# Patient Record
Sex: Female | Born: 1985 | Hispanic: Yes | Marital: Married | Smoking: Never smoker
Health system: Southern US, Community
[De-identification: ages and names within clinical notes are randomized; demographics above are authoritative.]

## PROBLEM LIST (undated history)

## (undated) HISTORY — PX: EYE SURGERY: SHX253

---

## 2009-06-14 ENCOUNTER — Emergency Department: Payer: Self-pay | Admitting: Emergency Medicine

## 2009-07-24 ENCOUNTER — Encounter: Payer: Self-pay | Admitting: Maternal and Fetal Medicine

## 2009-08-07 ENCOUNTER — Encounter: Payer: Self-pay | Admitting: Maternal and Fetal Medicine

## 2009-08-14 ENCOUNTER — Encounter: Payer: Self-pay | Admitting: Maternal & Fetal Medicine

## 2009-08-17 ENCOUNTER — Encounter: Payer: Self-pay | Admitting: Maternal and Fetal Medicine

## 2009-10-16 ENCOUNTER — Encounter: Payer: Self-pay | Admitting: Maternal and Fetal Medicine

## 2009-11-20 ENCOUNTER — Encounter: Payer: Self-pay | Admitting: Obstetrics & Gynecology

## 2009-11-30 ENCOUNTER — Encounter: Payer: Self-pay | Admitting: Maternal & Fetal Medicine

## 2009-12-07 ENCOUNTER — Encounter: Payer: Self-pay | Admitting: Obstetrics and Gynecology

## 2009-12-14 ENCOUNTER — Encounter: Payer: Self-pay | Admitting: Obstetrics & Gynecology

## 2009-12-18 ENCOUNTER — Inpatient Hospital Stay: Payer: Self-pay

## 2013-02-26 LAB — HM PAP SMEAR: HM Pap smear: NEGATIVE

## 2015-11-17 LAB — HM HIV SCREENING LAB: HM HIV Screening: NEGATIVE

## 2018-03-24 ENCOUNTER — Emergency Department
Admission: EM | Admit: 2018-03-24 | Discharge: 2018-03-24 | Disposition: A | Discharge status: Home / Self Care | Payer: Worker's Compensation | Attending: Emergency Medicine | Admitting: Emergency Medicine

## 2018-03-24 ENCOUNTER — Encounter: Payer: Self-pay | Admitting: Emergency Medicine

## 2018-03-24 ENCOUNTER — Emergency Department: Payer: Worker's Compensation

## 2018-03-24 DIAGNOSIS — M25512 Pain in left shoulder: Secondary | ICD-10-CM | POA: Insufficient documentation

## 2018-03-24 DIAGNOSIS — M545 Low back pain, unspecified: Secondary | ICD-10-CM

## 2018-03-24 LAB — POCT PREGNANCY, URINE: PREG TEST UR: NEGATIVE

## 2018-03-24 MED ORDER — KETOROLAC TROMETHAMINE 30 MG/ML IJ SOLN
30.0000 mg | Freq: Once | INTRAMUSCULAR | Status: AC
  Administered 2018-03-24: 30 mg via INTRAMUSCULAR
  Filled 2018-03-24: qty 1

## 2018-03-24 MED ORDER — CYCLOBENZAPRINE HCL 10 MG PO TABS
5.0000 mg | ORAL_TABLET | Freq: Once | ORAL | Status: AC
  Administered 2018-03-24: 5 mg via ORAL
  Filled 2018-03-24: qty 1

## 2018-03-24 MED ORDER — CYCLOBENZAPRINE HCL 5 MG PO TABS: ORAL_TABLET | ORAL | 0 refills | Status: DC

## 2018-03-24 MED ORDER — OXYCODONE-ACETAMINOPHEN 5-325 MG PO TABS
1.0000 | ORAL_TABLET | Freq: Once | ORAL | Status: AC
  Administered 2018-03-24: 1 via ORAL
  Filled 2018-03-24: qty 1

## 2018-03-24 MED ORDER — IBUPROFEN 600 MG PO TABS: 600.0000 mg | ORAL_TABLET | Freq: Four times a day (QID) | ORAL | 0 refills | Status: AC | PRN

## 2018-03-24 MED ORDER — TRAMADOL HCL 50 MG PO TABS: 50.0000 mg | ORAL_TABLET | Freq: Four times a day (QID) | ORAL | 0 refills | Status: AC | PRN

## 2018-03-24 NOTE — ED Triage Notes (Signed)
Using interpreter Darin Engelsbraham [phone], pt states that she was at work sitting at her desk when a fork lift came by and dropped some boxes on her lower back. Pt was sent here by work. Pt unsure of name of company.

## 2018-03-24 NOTE — ED Provider Notes (Signed)
[facility] Emergency Department Provider Note  ____________________________________________  Time seen: Approximately 10:07 AM  I have reviewed the triage vital signs and the nursing notes.   HISTORY  Chief Complaint Back Pain    HPI Sandra Pollard is a 32 y.o. female presents emergency department for evaluation of low back pain and left shoulder pain after injury at work today.  Patient was sitting at her desk when a forklift operator pushed a box into her back.  She is unsure how much the back weighs but states that it takes 2 people to lift the box.  She can walk but with pain.  Pain does not radiate.  She also has pain to her left shoulder that is worse with movement.  No alleviating measures have been attempted.   History reviewed. No pertinent past medical history.  There are no active problems to display for this patient.   History reviewed. No pertinent surgical history.  Prior to Admission medications   Medication Sig Start Date End Date Taking? Authorizing Provider  cyclobenzaprine (FLEXERIL) 5 MG tablet Take 1-2 tablets 3 times daily as needed 03/24/18   Enid Derry, PA-C  ibuprofen (ADVIL,MOTRIN) 600 MG tablet Take 1 tablet (600 mg total) by mouth every 6 (six) hours as needed. 03/24/18   Enid Derry, PA-C  traMADol (ULTRAM) 50 MG tablet Take 1 tablet (50 mg total) by mouth every 6 (six) hours as needed. 03/24/18 03/24/19  Enid Derry, PA-C    Allergies Patient has no known allergies.  No family history on file.  Social History Social History   Tobacco Use  . Smoking status: Never Smoker  . Smokeless tobacco: Never Used  Substance Use Topics  . Alcohol use: Never    Frequency: Never  . Drug use: Never     Review of Systems  Cardiovascular: No chest pain. Respiratory: No cough. No SOB. Gastrointestinal: No abdominal pain.  No nausea, no vomiting.  Musculoskeletal: Positive for back and shoulder pain. Skin:  Negative for rash, abrasions, lacerations, ecchymosis. Neurological: Negative for headaches, numbness or tingling   ____________________________________________   PHYSICAL EXAM:  VITAL SIGNS: ED Triage Vitals  Enc Vitals Group     BP 03/24/18 0921 (!) 148/68     Pulse Rate 03/24/18 0921 85     Resp 03/24/18 0921 18     Temp 03/24/18 0921 98.8 F (37.1 C)     Temp Source 03/24/18 0921 Oral     SpO2 03/24/18 0921 100 %     Weight 03/24/18 0922 115 lb (52.2 kg)     Height --      Head Circumference --      Peak Flow --      Pain Score 03/24/18 0922 7     Pain Loc --      Pain Edu? --      Excl. in GC? --      Constitutional: Alert and oriented. Well appearing and in no acute distress. Eyes: Conjunctivae are normal. PERRL. EOMI. Head: Atraumatic. ENT:      Ears:      Nose: No congestion/rhinnorhea.      Mouth/Throat: Mucous membranes are moist.  Neck: No stridor.  No cervical spine tenderness to palpation. Cardiovascular: Normal rate, regular rhythm.  Good peripheral circulation. Respiratory: Normal respiratory effort without tachypnea or retractions. Lungs CTAB. Good air entry to the bases with no decreased or absent breath sounds. Gastrointestinal: Bowel sounds 4 quadrants. Soft and nontender to palpation. No guarding or  rigidity. No palpable masses. No distention.  Musculoskeletal: Full range of motion to all extremities. No gross deformities appreciated.  Pain with range of motion to shoulder.  Tenderness to palpation over posterior shoulder.  Tenderness to palpation through lumbar spine and lumbar paraspinal muscle.  Strength equal in lower extremity's bilaterally.  Normal but slow gait. Neurologic:  Normal speech and language. No gross focal neurologic deficits are appreciated.  Skin:  Skin is warm, dry and intact. No rash noted.  No ecchymosis. Psychiatric: Mood and affect are normal. Speech and behavior are normal. Patient exhibits appropriate insight and  judgement.   ____________________________________________   LABS (all labs ordered are listed, but only abnormal results are displayed)  Labs Reviewed  POC URINE PREG, ED  POCT PREGNANCY, URINE   ____________________________________________  EKG   ____________________________________________  RADIOLOGY Lexine Baton, personally viewed and evaluated these images (plain radiographs) as part of my medical decision making, as well as reviewing the written report by the radiologist.  Dg Lumbar Spine Complete  Result Date: 03/24/2018 CLINICAL DATA:  Pain after boxes dropped on back region. EXAM: LUMBAR SPINE - COMPLETE 4+ VIEW COMPARISON:  None. FINDINGS: Frontal, lateral, spot lumbosacral lateral, and bilateral oblique views were obtained. There are 5 non-rib-bearing lumbar type vertebral bodies. There is no fracture or spondylolisthesis. Disc spaces appear normal. There is no appreciable facet arthropathy. IMPRESSION: No fracture or spondylolisthesis.  No evident arthropathy. Electronically Signed   By: Bretta Bang III M.D.   On: 03/24/2018 10:56   Dg Shoulder Left  Result Date: 03/24/2018 CLINICAL DATA:  Boxes dropped on back and shoulder EXAM: LEFT SHOULDER - 2+ VIEW COMPARISON:  None. FINDINGS: Oblique, Y scapular, and axillary images were obtained. No fracture or dislocation. Joint spaces appear normal. No erosive change or intra-articular calcification. Visualized left lung clear. IMPRESSION: No fracture or dislocation.  No evident arthropathy. Electronically Signed   By: Bretta Bang III M.D.   On: 03/24/2018 10:55    ____________________________________________    PROCEDURES  Procedure(s) performed:    Procedures    Medications  oxyCODONE-acetaminophen (PERCOCET/ROXICET) 5-325 MG per tablet 1 tablet (1 tablet Oral Given 03/24/18 1019)  ketorolac (TORADOL) 30 MG/ML injection 30 mg (30 mg Intramuscular Given 03/24/18 1128)  cyclobenzaprine (FLEXERIL) tablet  5 mg (5 mg Oral Given 03/24/18 1128)     ____________________________________________   INITIAL IMPRESSION / ASSESSMENT AND PLAN / ED COURSE  Pertinent labs & imaging results that were available during my care of the patient were reviewed by me and considered in my medical decision making (see chart for details).  Review of the [place] CSRS was performed in accordance of the [facility] prior to dispensing any controlled drugs.     Patient presented to the emergency department for evaluation after work injury.  X-rays are negative for acute bony abnormalities.  Pain improved with Percocet, Toradol, Flexeril.  Patient is up walking around.. Patient will be discharged home with prescriptions for tramadol, ibuprofen, Flexeril. Patient is to follow up with primary care as directed. Patient is given ED precautions to return to the ED for any worsening or new symptoms.     ____________________________________________  FINAL CLINICAL IMPRESSION(S) / ED DIAGNOSES  Final diagnoses:  Acute left-sided low back pain without sciatica  Acute pain of left shoulder      NEW MEDICATIONS STARTED DURING THIS VISIT:  ED Discharge Orders         Ordered    traMADol (ULTRAM) 50 MG tablet  Every 6  hours PRN     03/24/18 1232    ibuprofen (ADVIL,MOTRIN) 600 MG tablet  Every 6 hours PRN     03/24/18 1232    cyclobenzaprine (FLEXERIL) 5 MG tablet     03/24/18 1232              This chart was dictated using voice recognition software/Dragon. Despite best efforts to proofread, errors can occur which can change the meaning. Any change was purely unintentional.    Enid DerryWagner, Breasia Karges, PA-C 03/24/18 1552    Jene EveryKinner, Robert, MD 03/26/18 [phone]

## 2018-03-24 NOTE — ED Notes (Signed)
See triage note  Presents with back pain    States someone dropped some boxes on back

## 2019-04-05 ENCOUNTER — Telehealth: Payer: Self-pay | Admitting: Family Medicine

## 2019-04-05 NOTE — Telephone Encounter (Signed)
Patient is stating she is due for removal of Nexplanon and wishes to have another one inserted. Also is complaining of pain and she thinks it is because it is due to expiring device.

## 2019-04-06 NOTE — Telephone Encounter (Signed)
This RN called pt back with [place], [place] interpreter. Call went to voicemail and message left to return call back to [facility] and call back phone # provided.Ronny Bacon, RN

## 2019-04-06 NOTE — Telephone Encounter (Signed)
Pt had called back to [facility] but then lost phone connection, so this RN with Rowland Lathe to interpret called pt back. Pt states that she was calling as she desires to have Nexplanon removal and reinsertion. Pt has Nexplanon that was placed 03/2016 per [facility] records and per pt. Pt reports she is having some pain at the site of her Nexplanon that started 2 or 3 weeks ago. Pt reports that it is warm and painful to touch and there is a "pimple" near the area, but no swelling, redness or drainage at the site. Pt also reports having a headache and menstrual bleeding that comes and goes for the last 2 to 3 weeks as well. Pt then stated that she needed to go and have [place] call her back after 3pm as she is at work now. Counseled pt we would call her back. Pt states understanding. Spoke with Antoine Primas, PA about pt's symptoms and desire for Nexplanon removal and reinsertion. Per Antoine Primas, PA pt should come in for Prob FP visit to be evaluated for symptoms with arm around Nexplanon site before scheduling for Nexplanon removal and reinsertion. Will call pt back after 3pm per pt's request.Jarrett Albor Eleonore Chiquito, RN

## 2019-04-06 NOTE — Telephone Encounter (Signed)
Called pt back after 3:00pm per pt request with Rowland Lathe to interpret. Counseled pt per Antoine Primas, PA recommendation that she should come in for [facility] Prob visit so they can evaluate her Nexplanon site prior to having Nexplanon removal and reinsertion (see previous phone note). Pt states understanding and that she would like to come in for [facility] Prob visit Friday 04/09/2019. FP Prob visit scheduled for 04/09/2019 at 10am. Pt aware of appt date and time and to arrive at 9:45am for check-in. Pt with no other questions or concerns at this time.Ronny Bacon, RN

## 2019-04-09 ENCOUNTER — Ambulatory Visit: Payer: Self-pay | Admitting: Physician Assistant

## 2019-04-09 ENCOUNTER — Encounter: Payer: Self-pay | Admitting: Physician Assistant

## 2019-04-09 ENCOUNTER — Other Ambulatory Visit: Payer: Self-pay

## 2019-04-09 VITALS — BP 100/52 | Ht 64.0 in | Wt 162.0 lb

## 2019-04-09 DIAGNOSIS — Z113 Encounter for screening for infections with a predominantly sexual mode of transmission: Secondary | ICD-10-CM

## 2019-04-09 DIAGNOSIS — Z01419 Encounter for gynecological examination (general) (routine) without abnormal findings: Secondary | ICD-10-CM

## 2019-04-09 DIAGNOSIS — Z3046 Encounter for surveillance of implantable subdermal contraceptive: Secondary | ICD-10-CM

## 2019-04-09 DIAGNOSIS — Z3009 Encounter for other general counseling and advice on contraception: Secondary | ICD-10-CM

## 2019-04-09 LAB — WET PREP FOR TRICH, YEAST, CLUE
Trichomonas Exam: NEGATIVE
Yeast Exam: NEGATIVE

## 2019-04-09 NOTE — Progress Notes (Signed)
Family Planning Visit  Subjective:  Sandra Pollard is a 33 y.o. being seen today  to discuss family planning options.    She is currently using Nexplanon  for pregnancy prevention. Patient reports she does not  want a pregnancy in the next year. Patient  does not have a problem list on file.  Chief Complaint  Patient presents with  . Contraception    issues with Nexplanon    Patient reports that she would like a Nexplanon removal/reinsertion but has had pain at the site of her current device and also reports redness and warmth in the area.  States that she has not had any change to personal or family history since visit in 2017.  Requests STD testing today and is without symptoms.  Patient denies injury to arm in area where Nexplanon is placed.  Denies numbness, tingling, and loss of strength, bruising and swelling.   Does the patient desire a pregnancy in the next year? (OKQ flowsheet)  See flowsheet for other program required questions.   Body mass index is 27.81 kg/m. - Patient is eligible for diabetes screening based on BMI and age >23?  not applicable HA1C ordered? not applicable  Patient reports 1 of partners in last year. Desires STI screening?  Yes  Does the patient have a current or past history of drug use? No   No components found for: HCV]    Health Maintenance Due  Topic Date Due  . TETANUS/TDAP  07/07/2002  . PAP SMEAR-Modifier  02/27/2016  . INFLUENZA VACCINE  01/30/2019    Review of Systems  All other systems reviewed and are negative.   The following portions of the patient's history were reviewed and updated as appropriate: allergies, current medications, past family history, past medical history, past social history, past surgical history and problem list. Problem list updated.  Objective:   Vitals:   04/09/19 1000  BP: (!) 100/52  Weight: 162 lb (73.5 kg)  Height: 5\' 4"  (1.626 m)    Physical Exam Vitals signs reviewed.   Constitutional:      General: She is not in acute distress.    Appearance: Normal appearance.  HENT:     Head: Normocephalic and atraumatic.  Eyes:     Conjunctiva/sclera: Conjunctivae normal.  Neck:     Musculoskeletal: Neck supple.  Pulmonary:     Effort: Pulmonary effort is normal.  Abdominal:     Palpations: Abdomen is soft. There is no mass.     Tenderness: There is no abdominal tenderness. There is no guarding or rebound.  Genitourinary:    General: Normal vulva.     Rectum: Normal.     Comments: External genitalia/pubic area without nits, lice, edema, erythema, lesions and inguinal adenopathy. Vagina with normal mucosa and discharge. Cervix without visible lesions. Uterus firm, mobile, nt, no masses, no CMT, no adnexal tenderness or fullness. Musculoskeletal: Normal range of motion.     Comments: Upper extremities with equal strength, sensation to touch and reflexes bilaterally. Nexplanon implant palpable in L upper arm and no edema, erythema, or warmth on exam today.  Lymphadenopathy:     Cervical: No cervical adenopathy.  Skin:    General: Skin is warm and dry.     Findings: No bruising, erythema, lesion or rash.  Neurological:     Mental Status: She is alert and oriented to person, place, and time.  Psychiatric:        Mood and Affect: Mood normal.  Behavior: Behavior normal.        Thought Content: Thought content normal.        Judgment: Judgment normal.       Assessment and Plan:  Sandra Pollard is a 33 y.o. female presenting to the [facility] Department for a family planning visit  Contraception counseling: Reviewed all forms of birth control options available including abstinence; over the counter/barrier methods; hormonal contraceptive medication including pill, patch, ring, injection,contraceptive implant; hormonal and nonhormonal IUDs; permanent sterilization options including vasectomy and the various tubal sterilization  modalities. Risks and benefits reviewed.  Questions were answered.  Written information was also given to the patient to review.  Patient desires a removal/reinsertion of the Nexplanon. She will follow up in per her appt  for surveillance.  She was told to call with any further questions, or with any concerns about this method of contraception.  Emphasized use of condoms 100% of the time for STI prevention.  1. Encounter for counseling regarding contraception Counseled that we can remove and reinsert a new Nexplanon if that is what patient desires. Reassured that I do not see any sign of infection on exam today.  2. Screening for STD (sexually transmitted disease) Patient without symptoms today. Await test results.  Counseled that RN will call if needs to RTC for any treatment once results are back. - WET PREP FOR [place], YEAST, CLUE - Chlamydia/Gonorrhea [place] [facility] - HIV [place] [facility] - Syphilis Serology, [place] [facility]  3. Encounter for surveillance of implantable subdermal contraceptive Please schedule for removal/reinsertion per patient request.  4. Gynecologic exam normal Pap due and performed today. Counseled that RN will notify her of results and follow up plan once results are back.  - IGP, Aptima HPV     No follow-ups on file.  Future Appointments  Date Time Provider [place]  04/16/2019  2:40 PM [facility]FP PROVIDER [facility] None    Jerene Dilling, PA

## 2019-04-09 NOTE — Progress Notes (Signed)
Pt here as she is having some issues with her Nexplanon feeling like it is hot to touch and having pain at the site, reports site was really red 2 days ago, also had really bad headaches 2 days ago as well. Nexplanon was placed 03/2016. Pt is satisfied with Nexplanon as BCM. Pt desires blood work for HIV and syphillis and also desires STD screening.Ronny Bacon, RN

## 2019-04-09 NOTE — Progress Notes (Signed)
Wet mount reviewed and no treatment needed per standing order. Pt scheduled for Nexplanon removal and reinsertion for 04/16/2019 at 2:40pm per pt request. Pt aware of appt date and time and to arrive early for check-in and appt card given to pt. Pt also counseled that from now until coming in for appt that she should either not have sex or use condoms every time and pt states understanding. Provider orders completed.Ronny Bacon, RN

## 2019-04-15 LAB — IGP, APTIMA HPV
HPV Aptima: NEGATIVE
PAP Smear Comment: 0

## 2019-04-16 ENCOUNTER — Other Ambulatory Visit: Payer: Self-pay

## 2019-04-16 ENCOUNTER — Ambulatory Visit (LOCAL_COMMUNITY_HEALTH_CENTER): Payer: Self-pay | Admitting: Physician Assistant

## 2019-04-16 ENCOUNTER — Encounter: Payer: Self-pay | Admitting: Physician Assistant

## 2019-04-16 VITALS — BP 117/65 | Ht 64.0 in | Wt 164.0 lb

## 2019-04-16 DIAGNOSIS — Z3046 Encounter for surveillance of implantable subdermal contraceptive: Secondary | ICD-10-CM

## 2019-04-16 DIAGNOSIS — Z3009 Encounter for other general counseling and advice on contraception: Secondary | ICD-10-CM

## 2019-04-16 DIAGNOSIS — Z30017 Encounter for initial prescription of implantable subdermal contraceptive: Secondary | ICD-10-CM

## 2019-04-16 MED ORDER — ETONOGESTREL 68 MG ~~LOC~~ IMPL
68.0000 mg | DRUG_IMPLANT | Freq: Once | SUBCUTANEOUS | Status: AC
  Administered 2019-04-17: 68 mg via SUBCUTANEOUS

## 2019-04-16 NOTE — Progress Notes (Signed)
Pt here today for Nexplanon removal and reinsertion.Ronny Bacon, RN

## 2019-04-17 NOTE — Progress Notes (Signed)
Family Planning Visit- Repeat Yearly Visit  Subjective:  Sandra Pollard is a 33 y.o. being seen today for a Nexplanon removal/reinsertion.    She is currently using Nexplanon for pregnancy prevention. Patient reports she does not  want a pregnancy in the next year. Patient  does not have a problem list on file.  Chief Complaint  Patient presents with  . Contraception    Nexplanon removal and reinsertion    Patient reports that she has had no trouble with the Nexplanon and would like to continue using this method.  Thinks that the last time she had a PE was in 2017 when she got her current Nexplanon and is not sure whether she got her pap then or before that.    Patient denies that she has had any change to her personal or family history since her last visit in 2017.   Does the patient desire a pregnancy in the next year? (OKQ flowsheet)  See flowsheet for other program required questions.   Body mass index is 28.15 kg/m. - Patient is eligible for diabetes screening based on BMI and age >40?  not applicable JW1X ordered? not applicable  Patient reports 1 of partners in last year. Desires STI screening?  No - .  Does the patient have a current or past history of drug use? No   No components found for: HCV]   Health Maintenance Due  Topic Date Due  . TETANUS/TDAP  07/07/2002  . INFLUENZA VACCINE  01/30/2019    Review of Systems  All other systems reviewed and are negative.   The following portions of the patient's history were reviewed and updated as appropriate: allergies, current medications, past family history, past medical history, past social history, past surgical history and problem list. Problem list updated.  Objective:   Vitals:   04/16/19 1456  BP: 117/65  Weight: 164 lb (74.4 kg)  Height: 5\' 4"  (1.626 m)    Physical Exam Vitals signs reviewed.  Constitutional:      General: She is not in acute distress.    Appearance: Normal appearance.   HENT:     Head: Normocephalic and atraumatic.  Eyes:     Conjunctiva/sclera: Conjunctivae normal.  Pulmonary:     Effort: Pulmonary effort is normal.  Musculoskeletal:     Comments: Left upper arm with Nexplanon easily palpable.  Skin:    General: Skin is warm and dry.     Findings: No bruising, erythema, lesion or rash.  Neurological:     Mental Status: She is alert and oriented to person, place, and time.  Psychiatric:        Mood and Affect: Mood normal.        Behavior: Behavior normal.        Thought Content: Thought content normal.        Judgment: Judgment normal.       Assessment and Plan:  Sandra Pollard is a 33 y.o. female presenting to the [facility] Department for a Nexplanon removal/reinsertion.  Contraception counseling: Reviewed all forms of birth control options available including abstinence; over the counter/barrier methods; hormonal contraceptive medication including pill, patch, ring, injection,contraceptive implant; hormonal and nonhormonal IUDs; permanent sterilization options including vasectomy and the various tubal sterilization modalities. Risks and benefits reviewed.  Questions were answered.  Written information was also given to the patient to review.  Patient desires Nexplanon removal/reinsertion, this was prescribed for patient. She will follow up in  3 months for PE  and prn for surveillance.  She was told to call with any further questions, or with any concerns about this method of contraception.  Emphasized use of condoms 100% of the time for STI prevention.  1. Encounter for counseling regarding contraception Counseled patient that PE is due now and pap is also likely due either now or soon. Rec condoms with all sex for 10 days after reinsertion. Call with questions or concerns.  2. Nexplanon removal/reinsertion Nexplanon Removal and Insertion  Patient identified, informed consent performed, consent signed.   Patient does  understand that irregular bleeding is a very common side effect of this medication. She was advised to have backup contraception for one week after replacement of the implant. Patient deemed to meet WHO criteria for being reasonably certain she is not pregnant.  Appropriate time out taken. Nexplanon site identified. Area prepped in usual sterile fashon. 2 ml of 1% lidocaine with epinephrine was used to anesthetize the area at the distal end of the implant. A small stab incision was made right beside the implant on the distal portion. The Nexplanon rod was grasped manually and removed without difficulty. There was minimal blood loss. There were no complications.   Confirmed correct location of insertion site. The insertion site was identified 8-10 cm (3-4 inches) from the medial epicondyle of the humerus and 3-5 cm (1.25-2 inches) posterior to (below) the sulcus (groove) between the biceps and triceps muscles of the patient's Left arm. New Nexplanon removed from packaging, Device confirmed in needle, then inserted full length of needle and withdrawn per handbook instructions. Nexplanon was able to palpated in the patient's Left arm; patient palpated the insert herself.  There was minimal blood loss. Patient insertion site covered with guaze and a pressure bandage to reduce any bruising. The patient tolerated the procedure well and was given post procedure instructions.   Nexplanon:   Counseled patient to take OTC analgesic starting as soon as lidocaine starts to wear off and take regularly for at least 48 hr to decrease discomfort.  Specifically to take with food or milk to decrease stomach upset and for IB 600 mg (3 tablets) every 6 hrs; IB 800 mg (4 tablets) every 8 hrs; or Aleve 2 tablets every 12 hrs.  - etonogestrel (NEXPLANON) implant 68 mg     No follow-ups on file.  No future appointments.  Matt Holmes, PA

## 2019-06-18 ENCOUNTER — Encounter: Payer: Self-pay | Admitting: Physician Assistant

## 2019-06-18 NOTE — Progress Notes (Signed)
Patient with pap done 04/09/2019.  Pap negative and HPV negative.  Repeat cotest recommended 03/2024, (5 years).

## 2019-06-20 NOTE — Progress Notes (Signed)
Pap card mailed. Next pap due in 5 years per [place] PA. Aileen Fass, RN

## 2021-05-27 ENCOUNTER — Emergency Department: Payer: Self-pay

## 2021-05-27 ENCOUNTER — Emergency Department
Admission: EM | Admit: 2021-05-27 | Discharge: 2021-05-27 | Disposition: A | Discharge status: Home / Self Care | Payer: Self-pay | Attending: Emergency Medicine | Admitting: Emergency Medicine

## 2021-05-27 ENCOUNTER — Other Ambulatory Visit: Payer: Self-pay

## 2021-05-27 ENCOUNTER — Encounter: Payer: Self-pay | Admitting: Emergency Medicine

## 2021-05-27 DIAGNOSIS — N63 Unspecified lump in unspecified breast: Secondary | ICD-10-CM | POA: Insufficient documentation

## 2021-05-27 DIAGNOSIS — N6310 Unspecified lump in the right breast, unspecified quadrant: Secondary | ICD-10-CM

## 2021-05-27 DIAGNOSIS — N6001 Solitary cyst of right breast: Secondary | ICD-10-CM | POA: Insufficient documentation

## 2021-05-27 DIAGNOSIS — N644 Mastodynia: Secondary | ICD-10-CM

## 2021-05-27 LAB — CBC
HCT: 39.5 % (ref 36.0–46.0)
Hemoglobin: 13 g/dL (ref 12.0–15.0)
MCH: 30.5 pg (ref 26.0–34.0)
MCHC: 32.9 g/dL (ref 30.0–36.0)
MCV: 92.7 fL (ref 80.0–100.0)
Platelets: 318 10*3/uL (ref 150–400)
RBC: 4.26 MIL/uL (ref 3.87–5.11)
RDW: 12.2 % (ref 11.5–15.5)
WBC: 8.8 10*3/uL (ref 4.0–10.5)
nRBC: 0 % (ref 0.0–0.2)

## 2021-05-27 LAB — COMPREHENSIVE METABOLIC PANEL
ALT: 23 U/L (ref 0–44)
AST: 21 U/L (ref 15–41)
Albumin: 4 g/dL (ref 3.5–5.0)
Alkaline Phosphatase: 76 U/L (ref 38–126)
Anion gap: 6 (ref 5–15)
BUN: 10 mg/dL (ref 6–20)
CO2: 28 mmol/L (ref 22–32)
Calcium: 8.9 mg/dL (ref 8.9–10.3)
Chloride: 103 mmol/L (ref 98–111)
Creatinine, Ser: 0.63 mg/dL (ref 0.44–1.00)
GFR, Estimated: >60 mL/min (ref >60)
Glucose, Bld: 114 mg/dL — ABNORMAL HIGH (ref 70–99)
Potassium: 3.6 mmol/L (ref 3.5–5.1)
Sodium: 137 mmol/L (ref 135–145)
Total Bilirubin: 0.6 mg/dL (ref 0.3–1.2)
Total Protein: 7.8 g/dL (ref 6.5–8.1)

## 2021-05-27 LAB — POC URINE PREG, ED: Preg Test, Ur: NEGATIVE

## 2021-05-27 LAB — TROPONIN I (HIGH SENSITIVITY): Troponin I (High Sensitivity): <2 ng/L (ref <18)

## 2021-05-27 MED ORDER — IBUPROFEN 400 MG PO TABS
400.0000 mg | ORAL_TABLET | Freq: Once | ORAL | Status: AC
  Administered 2021-05-27: 15:00:00 400 mg via ORAL
  Filled 2021-05-27: qty 1

## 2021-05-27 MED ORDER — ACETAMINOPHEN 500 MG PO TABS
1000.0000 mg | ORAL_TABLET | Freq: Once | ORAL | Status: AC
  Administered 2021-05-27: 15:00:00 1000 mg via ORAL
  Filled 2021-05-27: qty 2

## 2021-05-27 NOTE — ED Notes (Signed)
In room for breast exam with MD

## 2021-05-27 NOTE — ED Provider Notes (Signed)
Emergency Medicine Provider Triage Evaluation Note  Sandra Pollard , a 35 y.o. female  was evaluated in triage.  Pt complains of midsternal chest pain that radiates down her right arm and began Friday prior to arrival.  Patient states that it is constant but has progressively gotten worse since onset.  Patient states worsening of this pain with any movement at the right shoulder or with taking a deep breath.  Review of Systems  Positive: Pleuritic chest pain, right arm pain Negative: Shortness of breath, nausea/vomiting/diarrhea, syncope  Physical Exam  BP 129/90 (BP Location: Right Arm)   Pulse 83   Temp 98.1 F (36.7 C) (Oral)   Resp 20   Ht 5\' 4"  (1.626 m)   Wt 74.4 kg   SpO2 99%   BMI 28.15 kg/m  Gen:   Awake, no distress   Resp:  Normal effort  MSK:   Moves extremities without difficulty  Other:  Right upper chest wall tenderness to palpation  Medical Decision Making  Medically screening exam initiated at 10:42 AM.  Appropriate orders placed.  Sandra Pollard was informed that the remainder of the evaluation will be completed by another provider, this initial triage assessment does not replace that evaluation, and the importance of remaining in the ED until their evaluation is complete.     Sandra Carry, MD 05/27/21 1043

## 2021-05-27 NOTE — Discharge Instructions (Addendum)
Your [place] today showed: 1. Septated cyst in the right breast at 3 o'clock, 6 cm from the nipple, 1.7 cm in greatest dimension, with adjacent hyperechoic tissue suggesting inflammation. Suspect that this is an inflammatory cyst. Abscess/infection felt less likely.   RECOMMENDATION: 1. Follow-up, outpatient diagnostic mammography and possible repeat ultrasound for further assessment.

## 2021-05-27 NOTE — ED Triage Notes (Signed)
Pt via POV from home c/o mid-sternal CP and radiates down her R arm that started Friday that has progressively gotten worse. Denies SOB. Denies NVD. Pt states she feels hot. Pt states when she moves it hurts more. Pt is A&Ox4 and NAD.  Pt needs a spanish interpreter.

## 2021-05-27 NOTE — ED Provider Notes (Signed)
[facility] Emergency Department Provider Note  ____________________________________________   Event Date/Time   First MD Initiated Contact with Patient 05/27/21 1506     (approximate)  I have reviewed the triage vital signs and the nursing notes.   HISTORY  Chief Complaint Chest Pain   HPI Sandra Pollard is a 35 y.o. female without significant past medical history who presents for assessment of 3 to 4 days of some pain on the inside of the right breast approximately the 2 o'clock position just off the areola as well some soreness in the right axilla.  Patient denies any bleeding or drainage from the nipple, injuries, cough, fevers, shortness of breath, headache, earache, sore throat, back pain rash or extremity pain.  Denies any vomiting or diarrhea.  She has a Nexplanon in her upper extremity.  No other acute concerns at this time.  She has not taken any analgesia today.         History reviewed. No pertinent past medical history.  There are no problems to display for this patient.   History reviewed. No pertinent surgical history.  Prior to Admission medications   Medication Sig Start Date End Date Taking? Authorizing Provider  cyclobenzaprine (FLEXERIL) 5 MG tablet Take 1-2 tablets 3 times daily as needed Patient not taking: Reported on 04/09/2019 03/24/18   Enid Derry, PA-C  etonogestrel (NEXPLANON) 68 MG IMPL implant 1 each by Subdermal route once. 04/01/16   Larene Pickett, FNP  ibuprofen (ADVIL,MOTRIN) 600 MG tablet Take 1 tablet (600 mg total) by mouth every 6 (six) hours as needed. Patient not taking: Reported on 04/16/2019 03/24/18   Enid Derry, PA-C    Allergies Patient has no known allergies.  History reviewed. No pertinent family history.  Social History Social History   Tobacco Use   Smoking status: Never   Smokeless tobacco: Never  Substance Use Topics   Alcohol use: Never   Drug use: Never    Review of  Systems  Review of Systems  Constitutional:  Negative for chills and fever.  HENT:  Negative for sore throat.   Eyes:  Negative for pain.  Respiratory:  Negative for cough and stridor.   Cardiovascular:  Positive for chest pain.  Gastrointestinal:  Negative for vomiting.  Genitourinary:  Negative for dysuria.  Musculoskeletal:  Negative for myalgias.  Skin:  Negative for rash.  Neurological:  Negative for seizures, loss of consciousness and headaches.  Psychiatric/Behavioral:  Negative for suicidal ideas.   All other systems reviewed and are negative.    ____________________________________________   PHYSICAL EXAM:  VITAL SIGNS: ED Triage Vitals  Enc Vitals Group     BP 05/27/21 1036 129/90     Pulse Rate 05/27/21 1036 83     Resp 05/27/21 1036 20     Temp 05/27/21 1036 98.1 F (36.7 C)     Temp Source 05/27/21 1036 Oral     SpO2 05/27/21 1036 99 %     Weight 05/27/21 1037 164 lb (74.4 kg)     Height 05/27/21 1037 5\' 4"  (1.626 m)     Head Circumference --      Peak Flow --      Pain Score 05/27/21 1037 8     Pain Loc --      Pain Edu? --      Excl. in GC? --    Vitals:   05/27/21 1036 05/27/21 1503  BP: 129/90 120/86  Pulse: 83 76  Resp: 20 18  Temp: 98.1 F (36.7 C)   SpO2: 99% 100%   Physical Exam Vitals and nursing note reviewed.  Constitutional:      General: She is not in acute distress.    Appearance: She is well-developed.  HENT:     Head: Normocephalic and atraumatic.     Right Ear: External ear normal.     Left Ear: External ear normal.     Nose: Nose normal.  Eyes:     Conjunctiva/sclera: Conjunctivae normal.  Cardiovascular:     Rate and Rhythm: Normal rate and regular rhythm.     Heart sounds: No murmur heard. Pulmonary:     Effort: Pulmonary effort is normal. No respiratory distress.     Breath sounds: Normal breath sounds.  Abdominal:     Palpations: Abdomen is soft.     Tenderness: There is no abdominal tenderness.   Musculoskeletal:        General: No swelling.     Cervical back: Neck supple.  Skin:    General: Skin is warm and dry.     Capillary Refill: Capillary refill takes less than 2 seconds.  Neurological:     Mental Status: She is alert and oriented to person, place, and time.  Psychiatric:        Mood and Affect: Mood normal.    On heparin exam of the patient's breast there is a palpable mass under the skin around the 2 o'clock position that is tender.  No overlying skin changes.  Areola is unremarkable.  There is also some mild right axillary lymphadenopathy.  There is no induration or other changes in left breast is unremarkable. ____________________________________________   LABS (all labs ordered are listed, but only abnormal results are displayed)  Labs Reviewed  COMPREHENSIVE METABOLIC PANEL - Abnormal; Notable for the following components:      Result Value   Glucose, Bld 114 (*)    All other components within normal limits  CBC  POC URINE PREG, ED  TROPONIN I (HIGH SENSITIVITY)   ____________________________________________  EKG  ECG remarkable for sinus rhythm with a ventricular rate of 78, normal axis, unremarkable intervals without evidence of acute ischemia or significant arrhythmia. ____________________________________________  RADIOLOGY  ED MD interpretation: Chest x-ray shows no effusion, edema, pneumothorax or focal consolidation or other acute process.  Ultrasound of the breast shows a septated cyst of the right breast at the 3 o'clock position with some hypoechoic tissue concerning for inflammation although felt less likely to be an abscess at this time.  Official radiology report(s): DG Chest 2 View  Result Date: 05/27/2021 CLINICAL DATA:  Chest pain EXAM: CHEST - 2 VIEW COMPARISON:  None. FINDINGS: Heart size and mediastinal contours are within normal limits. No suspicious pulmonary opacities identified. No pleural effusion or pneumothorax visualized. No  acute osseous abnormality appreciated. IMPRESSION: No acute intrathoracic process identified. Electronically Signed   By: Jannifer Hick M.D.   On: 05/27/2021 11:31   [place] CHEST SOFT TISSUE  Result Date: 05/27/2021 CLINICAL DATA:  Right breast pain. EXAM: ULTRASOUND OF THE RIGHT BREAST COMPARISON:  None. FINDINGS: Targeted ultrasound is performed, showing a cystic mass at 3 o'clock, 6 cm the nipple, measuring 1.7 x 1.0 x 1.5 cm. There are thin internal septations and a lobulated, circumscribed margin. Surrounding increased echogenicity is noted suggesting inflammation. IMPRESSION: 1. Septated cyst in the right breast at 3 o'clock, 6 cm from the nipple, 1.7 cm in greatest dimension, with adjacent hyperechoic tissue suggesting inflammation. Suspect that this is  an inflammatory cyst. Abscess/infection felt less likely. RECOMMENDATION: 1. Follow-up, outpatient diagnostic mammography and possible repeat ultrasound for further assessment. I have discussed the findings and recommendations with the patient. If applicable, a reminder letter will be sent to the patient regarding the next appointment. BI-RADS CATEGORY  3: Probably benign. Electronically Signed   By: Amie Portland M.D.   On: 05/27/2021 17:23    ____________________________________________   PROCEDURES  Procedure(s) performed (including Critical Care):  Procedures   ____________________________________________   INITIAL IMPRESSION / ASSESSMENT AND PLAN / ED COURSE      Patient presents with above-stated history exam for assessment of some pain on the right breast as described above.  On arrival she is afebrile and hemodynamically stable.  On assessment she does have an area of tenderness unable to palpate a mass in the right breast.  There is also some right axillar lymphadenopathy.  Patient did have some work-up initiated in triage.  However on my exam primary differential is related to possible cyst versus malignancy versus abscess  versus blocked duct.  There is no overlying skin changes fever or leukocytosis on CBC to suggest sepsis or systemic bacteremia.  Chest x-ray shows no effusion, edema, pneumothorax or focal consolidation or other acute process.  ECG remarkable for sinus rhythm with a ventricular rate of 78, normal axis, unremarkable intervals without evidence of acute ischemia or significant arrhythmia.  Nonelevated troponin not suggestive of ACS.  Pregnancy test is negative.  CMP shows no significant electrolyte metabolic derangements.  Ultrasound obtained.  Ultrasound shows appears to be inflammatory cyst with findings left likely representing an abscess per radiology.  Discussed this at length with the patient and has been using interpreter and strong condition to her follow-up in surgery clinic as patient will likely require further imaging with a mammogram and repeat ultrasound and possibly sampling with needle aspiration.  She may take Tylenol ibuprofen as needed for discomfort.  Discharged stable condition.  Strict return precautions advised and discussed.     ____________________________________________   FINAL CLINICAL IMPRESSION(S) / ED DIAGNOSES  Final diagnoses:  Breast pain, right  Mass of right breast, unspecified quadrant  Cyst of right breast    Medications  acetaminophen (TYLENOL) tablet 1,000 mg (1,000 mg Oral Given 05/27/21 1527)  ibuprofen (ADVIL) tablet 400 mg (400 mg Oral Given 05/27/21 1527)     ED Discharge Orders     None        Note:  This document was prepared using Dragon voice recognition software and may include unintentional dictation errors.    Gilles Chiquito, MD 05/27/21 [id number]

## 2021-05-31 ENCOUNTER — Ambulatory Visit (INDEPENDENT_AMBULATORY_CARE_PROVIDER_SITE_OTHER): Payer: Self-pay | Admitting: Surgery

## 2021-05-31 ENCOUNTER — Other Ambulatory Visit: Payer: Self-pay

## 2021-05-31 ENCOUNTER — Encounter: Payer: Self-pay | Admitting: Surgery

## 2021-05-31 VITALS — BP 136/84 | HR 72 | Temp 99.0°F | Ht 64.0 in | Wt 179.4 lb

## 2021-05-31 DIAGNOSIS — N6009 Solitary cyst of unspecified breast: Secondary | ICD-10-CM | POA: Insufficient documentation

## 2021-05-31 DIAGNOSIS — N6001 Solitary cyst of right breast: Secondary | ICD-10-CM

## 2021-05-31 DIAGNOSIS — N6312 Unspecified lump in the right breast, upper inner quadrant: Secondary | ICD-10-CM | POA: Insufficient documentation

## 2021-05-31 NOTE — Patient Instructions (Signed)
la llamaremos y programaremos una mamografa y Neomia Dear ecografa de sus senos.  le pediremos que haga un seguimiento aqu despus de que obtengamos todos los Laura.  haremos que alguien del programa [facility] se comunique con usted para Barista su mamografa

## 2021-05-31 NOTE — Progress Notes (Signed)
Patient ID: Sandra Pollard, female   DOB: 06/07/1986, 35 y.o.   MRN: [id number]  Chief Complaint: Tender mass right medial breast  History of Present Illness Sandra Pollard is a 35 y.o. female with acute onset of a tender mass in the patient's right medial breast.  Presented to the ED where an ultrasound was obtained showing a complex cyst, partially thickened cystic wall possibly inflammatory in nature.  No aspiration or formal treatment applied.  She apparently has reasonable pain control with ibuprofen and Tylenol.  Ultrasound was obtained from the ED, and a formal breast ultrasound/mammography was requested.  Patient is extremely worried about cancer.  She has had no fevers or chills.  No overlying skin changes or nipple discharge.  No recent breast trauma.  She is had no prior breast biopsies.  Her current birth control is Nexplanon implant she has no family history of breast cancer.  She began menstruating at the age of 3.  She is gravida 3 para 3.  Her first pregnancy was it was at the age of 32.  She breast-fed 3 children.  Past Medical History No past medical history on file.    No past surgical history on file.  No Known Allergies  Current Outpatient Medications  Medication Sig Dispense Refill   cyclobenzaprine (FLEXERIL) 5 MG tablet Take 1-2 tablets 3 times daily as needed (Patient not taking: Reported on 04/09/2019) 20 tablet 0   etonogestrel (NEXPLANON) 68 MG IMPL implant 1 each by Subdermal route once.     ibuprofen (ADVIL,MOTRIN) 600 MG tablet Take 1 tablet (600 mg total) by mouth every 6 (six) hours as needed. (Patient not taking: Reported on 04/16/2019) 30 tablet 0   No current facility-administered medications for this visit.    Family History No family history on file.    Social History Social History   Tobacco Use   Smoking status: Never   Smokeless tobacco: Never  Substance Use Topics   Alcohol use: Never   Drug use: Never        Review  of Systems  Constitutional:  Positive for chills, fever and malaise/fatigue.  HENT: Negative.    Eyes: Negative.   Respiratory: Negative.    Cardiovascular: Negative.   Gastrointestinal:  Positive for heartburn.  Genitourinary: Negative.   Skin: Negative.   Neurological:  Positive for headaches.  Psychiatric/Behavioral: Negative.       Physical Exam There were no vitals taken for this visit.   CONSTITUTIONAL: Well developed, and nourished, appropriately responsive and aware without distress.   EYES: Sclera non-icteric.   EARS, NOSE, MOUTH AND THROAT: Mask worn.    Hearing is intact to voice.  NECK: Trachea is midline, and there is no jugular venous distension.  LYMPH NODES:  Lymph nodes in the neck are not enlarged. RESPIRATORY:  Lungs are clear, and breath sounds are equal bilaterally. Normal respiratory effort without pathologic use of accessory muscles. CARDIOVASCULAR: Heart is regular in rate and rhythm. GI: The abdomen is  soft, nontender, and nondistended. There were no palpable masses. I did not appreciate hepatosplenomegaly. There were normal bowel sounds. GU: Caryl Lyn present as chaperone along with the interpreter.  There is a well-defined medial right breast mass consistent with the cyst in question.  It is mobile from the chest wall, and does not appear to involve the overlying skin.  There is no erythema, no evidence of nipple discharge.  Otherwise there are no dominant masses nor nodularity nor skin changes in either breast.  MUSCULOSKELETAL:  Symmetrical muscle tone appreciated in all four extremities.    SKIN: Skin turgor is normal. No pathologic skin lesions appreciated.  NEUROLOGIC:  Motor and sensation appear grossly normal.  Cranial nerves are grossly without defect. PSYCH:  Alert and oriented to person, place and time. Affect is appropriate for situation.  Data Reviewed I have personally reviewed what is currently available of the patient's imaging, recent labs  and medical records.   Labs:  CBC Latest Ref Rng & Units 05/27/2021  WBC 4.0 - 10.5 K/uL 8.8  Hemoglobin 12.0 - 15.0 g/dL 54.5  Hematocrit 62.5 - 46.0 % 39.5  Platelets 150 - 400 K/uL 318   CMP Latest Ref Rng & Units 05/27/2021  Glucose 70 - 99 mg/dL 638(L)  BUN 6 - 20 mg/dL 10  Creatinine 3.73 - 4.28 mg/dL 7.68  Sodium 115 - 726 mmol/L 137  Potassium 3.5 - 5.1 mmol/L 3.6  Chloride 98 - 111 mmol/L 103  CO2 22 - 32 mmol/L 28  Calcium 8.9 - 10.3 mg/dL 8.9  Total Protein 6.5 - 8.1 g/dL 7.8  Total Bilirubin 0.3 - 1.2 mg/dL 0.6  Alkaline Phos 38 - 126 U/L 76  AST 15 - 41 U/L 21  ALT 0 - 44 U/L 23      Imaging: Radiology review:  CLINICAL DATA:  Right breast pain.   EXAM: ULTRASOUND OF THE RIGHT BREAST   COMPARISON:  None.   FINDINGS: Targeted ultrasound is performed, showing a cystic mass at 3 o'clock, 6 cm the nipple, measuring 1.7 x 1.0 x 1.5 cm. There are thin internal septations and a lobulated, circumscribed margin. Surrounding increased echogenicity is noted suggesting inflammation.   IMPRESSION: 1. Septated cyst in the right breast at 3 o'clock, 6 cm from the nipple, 1.7 cm in greatest dimension, with adjacent hyperechoic tissue suggesting inflammation. Suspect that this is an inflammatory cyst. Abscess/infection felt less likely.   RECOMMENDATION: 1. Follow-up, outpatient diagnostic mammography and possible repeat ultrasound for further assessment.   I have discussed the findings and recommendations with the patient. If applicable, a reminder letter will be sent to the patient regarding the next appointment.   BI-RADS CATEGORY  3: Probably benign.     Electronically Signed   By: Amie Portland M.D.   On: 05/27/2021 17:23  Within last 24 hrs: No results found.  Assessment    Right breast mass, tender symptomatic, prior ultrasound showing complex cystic lesion. There are no problems to display for this patient.   Plan    Advised we proceed with  traditional breast imaging, and consideration of ultrasound-guided aspiration/biopsy.   Expected that pain will diminish with a cyst aspiration, I gave the degree of reassurance considering that breast cancer typically does not present with a painful cyst, however we will reserve final judgment until complete work-up is done.  We will have her follow-up after the imaging or as ordered.  Advise she continue to utilize Tylenol for pain. Face-to-face time spent with the patient and accompanying care providers(if present) was 30 minutes, with more than 50% of the time spent counseling, educating, and coordinating care of the patient.    These notes generated with voice recognition software. I apologize for typographical errors.  Campbell Lerner M.D., FACS 05/31/2021, 11:32 AM

## 2021-06-19 ENCOUNTER — Ambulatory Visit: Payer: Self-pay | Admitting: Surgery

## 2021-06-20 ENCOUNTER — Ambulatory Visit
Admission: RE | Admit: 2021-06-20 | Discharge: 2021-06-20 | Disposition: A | Discharge status: Home / Self Care | Payer: Self-pay | Source: Ambulatory Visit | Attending: Oncology | Admitting: Oncology

## 2021-06-20 ENCOUNTER — Encounter: Payer: Self-pay | Admitting: *Deleted

## 2021-06-20 ENCOUNTER — Other Ambulatory Visit: Payer: Self-pay

## 2021-06-20 ENCOUNTER — Ambulatory Visit: Payer: Self-pay | Attending: Oncology | Admitting: *Deleted

## 2021-06-20 VITALS — BP 124/72 | HR 73 | Temp 99.1°F | Ht 65.0 in | Wt 179.5 lb

## 2021-06-20 DIAGNOSIS — N6311 Unspecified lump in the right breast, upper outer quadrant: Secondary | ICD-10-CM | POA: Insufficient documentation

## 2021-06-20 NOTE — Progress Notes (Signed)
Subjective:     Patient ID: Sandra Pollard, female   DOB: Apr 03, 1986, 35 y.o.   MRN: [id number]  HPI  [facility] Medical History Record - 06/20/21 8315       Breast History   Screening cycle New    CBE Date 05/31/21    Provider (CBE) Dr. Claudine Mouton    Initial Mammogram 06/20/21    Last Mammogram Never    Recent Breast Symptoms Lump;Pain      Breast Cancer History   Breast Cancer History No personal or family history      Previous History of Breast Problems   Breast Surgery or Biopsy None    Breast Implants N/A    BSE Done Monthly      Gynecological/Obstetrical History   LMP 06/18/21    Is there any chance that the client could be pregnant?  No    Age at menarche 41    Age at menopause NA    Date of last PAP  --   2021   Provider (PAP) health department    Age at first live birth 86    Breast fed children Yes (type length in comments)   1 year   DES Exposure Unkown    Cervical, Uterine or Ovarian cancer No    Family history of Cervial, Uterine or Ovarian cancer No    Hysterectomy No    Cervix removed No    Ovaries removed No    Laser/Cryosurgery No    Current method of birth control Other (see comments)   Nexplanon   Current method of Estrogen/Hormone replacement None    Smoking history None               Review of Systems     Objective:   Physical Exam Chest:  Breasts:    Right: No swelling, bleeding, inverted nipple, mass, nipple discharge, skin change or tenderness.     Left: No swelling, bleeding, inverted nipple, mass, nipple discharge, skin change or tenderness.    Lymphadenopathy:     Upper Body:     Right upper body: No supraclavicular or axillary adenopathy.     Left upper body: No supraclavicular or axillary adenopathy.      Assessment:     35 year old Hispanic female referred to [facility] by [facility] for financial assistance for further evaluation of a right breast mass.  Sandra Pollard, the interpreter present during the interview and  exam.  Patient states she went to the ER in November for right breast pain.  An ultrasound was completed showing a septated cyst in the right breast.  Ultrasound was a birads 3 recommending a diagnostic mammogram.  Patient was then seen by Dr. Claudine Mouton.  Patient has no family history of breast or ovarian cancer.  On clinical breast exam today there is no dominant mass, skin changes, nipple discharge or lymphadenopathy.  I can palpate an approximate 1-2 cm thickening at 1-2:00 right breast.  The patient states this is the area of pain.  She also states that the mass has gotten smaller.  Taught self breast awareness.  Patient has been screened for eligibility.  She does not have any insurance, Medicare or Medicaid.  She also meets financial eligibility.   Risk Assessment     Risk Scores       06/20/2021   Last edited by: Jim Like, RN   5-year risk: 0.2 %   Lifetime risk: 5.9 %  Plan:     Will get bilateral diagnostic mammogram and ultrasound.  Patient has a scheduled follow up with Dr. Claudine Mouton on 07/05/21 per patient.  Will follow up per [facility] protocol.

## 2021-06-26 ENCOUNTER — Ambulatory Visit: Payer: Self-pay | Admitting: Surgery

## 2021-07-05 ENCOUNTER — Other Ambulatory Visit: Payer: Self-pay

## 2021-07-05 ENCOUNTER — Encounter: Payer: Self-pay | Admitting: Surgery

## 2021-07-05 ENCOUNTER — Ambulatory Visit (INDEPENDENT_AMBULATORY_CARE_PROVIDER_SITE_OTHER): Payer: Self-pay | Admitting: Surgery

## 2021-07-05 DIAGNOSIS — N6009 Solitary cyst of unspecified breast: Secondary | ICD-10-CM

## 2021-07-05 DIAGNOSIS — N6001 Solitary cyst of right breast: Secondary | ICD-10-CM

## 2021-07-05 NOTE — Patient Instructions (Addendum)
Please call our office if you have any questions or concerns. Be sure to have the Breast Ultrasound done prior to your follow up appointment.   Autoexamen de [employer] Breast Self-Awareness El autoexamen de mamas es para Solicitor la apariencia y la sensibilidad de las [place]. Es importante autoexaminarse las [place]. Permite detectar un problema en las mamas a tiempo mientras todava es pequeo y puede tratarse. Todas las mujeres deben autoexaminarse las [place], incluso aquellas que se sometieron a implantes mamarios. Informe al mdico si advierte un cambio en las mamas. Lo que necesita: Un espejo. Una habitacin bien iluminada. Cmo realizar el autoexamen de mamas El autoexamen de [place] es una forma de aprender qu es normal para sus mamas y revisar si hay cambios. Para hacer un autoexamen de las mamas: Busque cambios  Qutese toda la ropa por encima de la cintura. Prese frente a un espejo en una habitacin con buena iluminacin. Apoye las [employer] caderas. Empuje hacia abajo con las manos. Mrese las [employer] y los pezones en el espejo para ver si una mama o un pezn se ve diferente del otro. Durante el examen, intente determinar si: La forma de una mama es diferente. El tamao de una mama es diferente. Hay arrugas, depresiones y protuberancias en Deborha Payment y no en la otra. Observe cada mama para buscar cambios en la piel, por ejemplo: Enrojecimiento. Zonas escamosas. Observe si hay cambios en los pezones, por ejemplo: Lquido alrededor [employer]. Sangrado. Hoyuelos. Enrojecimiento. Un cambio en el lugar de los pezones. [place] si hay cambios  Acustese en el piso boca arriba. Plpese cada mama. Para hacerlo, siga estos pasos: Elija una mama para palpar. Coloque el brazo ms cercano a esa mama por encima de la cabeza. Use el otro brazo para palpar la zona del pezn de la mama. Plpese la zona con las yemas de los tres dedos del medio y haga crculos con los dedos. Con el primer  crculo, presione suavemente. Con el segundo, ms fuerte. Con el tercero, an ms fuerte. Siga haciendo crculos con los dedos con las diferentes presiones a medida que desciende por la mama. Detngase cuando sienta las costillas. Desplace los dedos un poco hacia el centro del cuerpo. Empiece a hacer crculos con los dedos nuevamente y esta vez haga movimientos ascendentes hasta llegar a la clavcula. Siga haciendo crculos [place] y [place] llegar a la axila. Recuerde hacerlos con las tres presiones. Plpese la otra mama de la misma forma. Sintese o prese en la ducha o la baera. Con agua jabonosa en la piel, plpese cada mama del mismo modo que lo hizo en el paso 2 mientras estaba acostada en el piso. Anote sus hallazgos Anotar lo que encuentra puede ayudarla a recordar qu contarle al mdico. [place] los siguientes datos: Qu es normal para cada mama. Cualquier cambio que encuentre en cada mama, por ejemplo: La clase de cambios que encuentra. Si tiene dolor. Si hay bultos, su tamao y [place]. Cundo tuvo su ltimo perodo menstrual. Consejos generales [employer]. Si est amamantando, el mejor momento para el examen de las mamas es despus de darle de Psychologist, clinical al beb o despus de usar un sacaleches. Si tiene perodos menstruales, el mejor momento para Media planner es de 5 a 7 das despus de la finalizado el perodo menstrual. Con el [place], se sentir cmoda con el autoexamen y Games developer a saber si hay cambios en sus mamas. Comunquese con un mdico si: Observa un  cambio en la forma o el tamao de las mamas o los pezones. Observa un cambio en la piel de las mamas o los pezones, como la piel enrojecida o escamosa. Tiene secrecin de lquido proveniente de los pezones que no es normal. [place] un ndulo o una zona engrosada que no tena antes. Tiene dolor en las [place]. Tiene alguna inquietud [employer] de la [place]. Resumen El autoexamen de  mamas incluye buscar cambios en las [place], y tambin palpar para Engineer, manufacturing cualquier cambio en las [place]. El autoexamen de mamas debe hacerse frente a un espejo en una habitacin bien iluminada. Debe revisarse las [employer]. Si tiene perodos menstruales, el mejor momento para hacerlo es de 5 a 7 das despus de la finalizado el perodo menstrual. Informe al mdico si ve cambios en las mamas, como cambios en el tamao, cambios en la piel, dolor o sensibilidad, o un lquido inusual que sale de los pezones. Esta informacin no tiene Theme park manager el consejo del mdico. Asegrese de hacerle al mdico cualquier pregunta que tenga. Document Revised: 03/17/2018 Document Reviewed: 03/17/2018 Elsevier Patient Education  2022 [employer].

## 2021-07-05 NOTE — Progress Notes (Signed)
Patient ID: Sandra Pollard, female   DOB: [birth year], 36 y.o.   MRN: [id number]  Chief Complaint: Tender mass right medial breast  History of Present Illness Our Sandra Pollard is present, she reports the area of tenderness is spontaneously resolved, confirming that she took no other medications or antibiotics.  She is much relieved knowing that the imaging is consistent with a benign process.  She is anticipating repeat ultrasound in 3 months.  Previously:Sandra Pollard is a 36 y.o. female with acute onset of a tender mass in the patient's right medial breast.  Presented to the ED where an ultrasound was obtained showing a complex cyst, partially thickened cystic wall possibly inflammatory in nature.  No aspiration or formal treatment applied.  She apparently has reasonable pain control with ibuprofen and Tylenol.  Ultrasound was obtained from the ED, and a formal breast ultrasound/mammography was requested.  Patient is extremely worried about cancer.  She has had no fevers or chills.  No overlying skin changes or nipple discharge.  No recent breast trauma.  She is had no prior breast biopsies.  Her current birth control is Nexplanon implant she has no family history of breast cancer.  She began menstruating at the age of 74.  She is gravida 3 para 3.  Her first pregnancy was it was at the age of 46.  She breast-fed 3 children.  Past Medical History History reviewed. No pertinent past medical history.    Past Surgical History:  Procedure Laterality Date   EYE SURGERY      No Known Allergies  Current Outpatient Medications  Medication Sig Dispense Refill   acetaminophen (TYLENOL) 500 MG tablet Take 500 mg by mouth every 6 (six) hours as needed.     etonogestrel (NEXPLANON) 68 MG IMPL implant 1 each by Subdermal route once.     ibuprofen (ADVIL,MOTRIN) 600 MG tablet Take 1 tablet (600 mg total) by mouth every 6 (six) hours as needed. 30 tablet 0   Propylene Glycol  0.6 % SOLN INSTILL 1 DROP 3-5 TIMES A DAY IN EACH EYE AS NEEDED FOR DRYNESS     No current facility-administered medications for this visit.    Family History History reviewed. No pertinent family history.    Social History Social History   Tobacco Use   Smoking status: Never   Smokeless tobacco: Never  Substance Use Topics   Alcohol use: Never   Drug use: Never        Review of Systems  Constitutional:  Positive for chills, fever and malaise/fatigue.  HENT: Negative.    Eyes: Negative.   Respiratory: Negative.    Cardiovascular: Negative.   Gastrointestinal:  Positive for heartburn.  Genitourinary: Negative.   Skin: Negative.   Neurological:  Positive for headaches.  Psychiatric/Behavioral: Negative.       Physical Exam There were no vitals taken for this visit.   CONSTITUTIONAL: Well developed, and nourished, appropriately responsive and aware without distress.   EYES: Sclera non-icteric.   EARS, NOSE, MOUTH AND THROAT: Mask worn.    Hearing is intact to voice.  NECK: Trachea is midline, and there is no jugular venous distension.  LYMPH NODES:  Lymph nodes in the neck are not enlarged. RESPIRATORY:  Lungs are clear, and breath sounds are equal bilaterally. Normal respiratory effort without pathologic use of accessory muscles. CARDIOVASCULAR: Heart is regular in rate and rhythm. GI: The abdomen is  soft, nontender, and nondistended. There were no palpable masses. I did not appreciate hepatosplenomegaly.  There were normal bowel sounds. GU: Sandra Pollard is present as chaperone along with the Pollard.  The previously well-defined medial right breast mass consistent with the cyst, has markedly diminished to vagueness.  This area remains mobile from the chest wall, and does not appear to involve the overlying skin.  There is no erythema, no evidence of nipple discharge.  Otherwise there are no dominant masses nor nodularity nor skin changes in either breast. MUSCULOSKELETAL:   Symmetrical muscle tone appreciated in all four extremities.    SKIN: Skin turgor is normal. No pathologic skin lesions appreciated.  NEUROLOGIC:  Motor and sensation appear grossly normal.  Cranial nerves are grossly without defect. PSYCH:  Alert and oriented to person, place and time. Affect is appropriate for situation.  Data Reviewed I have personally reviewed what is currently available of the patient's imaging, recent labs and medical records.   Labs:  CBC Latest Ref Rng & Units 05/27/2021  WBC 4.0 - 10.5 K/uL 8.8  Hemoglobin 12.0 - 15.0 g/dL 13.0  Hematocrit 36.0 - 46.0 % 39.5  Platelets 150 - 400 K/uL 318   CMP Latest Ref Rng & Units 05/27/2021  Glucose 70 - 99 mg/dL 114(H)  BUN 6 - 20 mg/dL 10  Creatinine 0.44 - 1.00 mg/dL 0.63  Sodium 135 - 145 mmol/L 137  Potassium 3.5 - 5.1 mmol/L 3.6  Chloride 98 - 111 mmol/L 103  CO2 22 - 32 mmol/L 28  Calcium 8.9 - 10.3 mg/dL 8.9  Total Protein 6.5 - 8.1 g/dL 7.8  Total Bilirubin 0.3 - 1.2 mg/dL 0.6  Alkaline Phos 38 - 126 U/L 76  AST 15 - 41 U/L 21  ALT 0 - 44 U/L 23      Imaging: Radiology review:  CLINICAL DATA:  36 year old female presenting for follow-up evaluation. Patient was seen in the emergency department on November 27th for pain in the lump in the right breast. On ultrasound at that time there was a mass at 3 o'clock felt to represent an inflamed cyst, less likely an abscess. The patient was placed on antibiotics. Patient reports significant improvement in symptoms in the right breast with no residual pain. She reports the mass is gotten significantly smaller.   EXAM: DIGITAL DIAGNOSTIC BILATERAL MAMMOGRAM WITH TOMOSYNTHESIS AND CAD; ULTRASOUND RIGHT BREAST LIMITED   TECHNIQUE: Bilateral digital diagnostic mammography and breast tomosynthesis was performed. The images were evaluated with computer-aided detection.; Targeted ultrasound examination of the right breast was performed   COMPARISON:  Previous  exam(s).   ACR Breast Density Category d: The breast tissue is extremely dense, which lowers the sensitivity of mammography.   FINDINGS: Mammogram:   Right breast: A skin BB marks the palpable site of concern in the upper inner right breast. A spot tangential view of this area was performed in addition to standard views. No definite abnormality is identified on the spot imaging. Additional spot compression tomosynthesis view was performed for a possible mass in the lower inner right breast. Possible cluster of small masses persists in the slightly inner inferior aspect of the breast measuring approximately 1 cm.   Left breast: No suspicious mass, distortion, or microcalcifications are identified to suggest presence of malignancy.   On physical exam of the medial right breast I feel a subtle area of thickening at the site where patient reports there was previously a mass. There is no erythema or skin changes.   Ultrasound:   Targeted ultrasound performed throughout the medial aspect of the right breast demonstrating multiple  scattered anechoic cysts and clusters of cysts. No suspicious solid mass is identified. At 3 o'clock 6 cm from the nipple there is an area of echogenic tissue with a small amount of interspersed fluid, no significant collection or cystic mass persists.   IMPRESSION: 1. At the palpable site of concern in the medial right breast there is a persistent subtle area of thickening which on ultrasound demonstrates an area of echogenic tissue with a small amount of interspersed fluid, likely residual infection/inflammation. No suspicious solid mass or drainable collection.   2.  No mammographic evidence of malignancy in the left breast.   RECOMMENDATION: Recommend patient complete course of antibiotics and return in 3 months for follow-up right breast ultrasound to ensure complete resolution of the presumed infection/inflammation in the medial right breast.  Patient was counseled to contact our office sooner should her symptoms worsen.   I have discussed the findings and recommendations with the patient. If applicable, a reminder letter will be sent to the patient regarding the next appointment.   BI-RADS CATEGORY  3: Probably benign.     Electronically Signed   By: Audie Pinto M.D.   On: 06/20/2021 11:15  Previously: CLINICAL DATA:  Right breast pain.   EXAM: ULTRASOUND OF THE RIGHT BREAST   COMPARISON:  None.   FINDINGS: Targeted ultrasound is performed, showing a cystic mass at 3 o'clock, 6 cm the nipple, measuring 1.7 x 1.0 x 1.5 cm. There are thin internal septations and a lobulated, circumscribed margin. Surrounding increased echogenicity is noted suggesting inflammation.   IMPRESSION: 1. Septated cyst in the right breast at 3 o'clock, 6 cm from the nipple, 1.7 cm in greatest dimension, with adjacent hyperechoic tissue suggesting inflammation. Suspect that this is an inflammatory cyst. Abscess/infection felt less likely.   RECOMMENDATION: 1. Follow-up, outpatient diagnostic mammography and possible repeat ultrasound for further assessment.   I have discussed the findings and recommendations with the patient. If applicable, a reminder letter will be sent to the patient regarding the next appointment.   BI-RADS CATEGORY  3: Probably benign.     Electronically Signed   By: Lajean Manes M.D.   On: 05/27/2021 17:23  Within last 24 hrs: No results found.  Assessment    Right breast mass, no longer tender, ultrasound confirming likely benign complex cystic lesion. Patient Active Problem List   Diagnosis Date Noted   Mass of upper inner quadrant of right breast 05/31/2021   Complex cyst of breast 05/31/2021     Plan    At the present time there is nothing to warrant aspiration to improve her symptoms as they improved on their own.  I believe her anxiety is markedly diminished.  We will obtain follow-up  ultrasound in 3 months and repeat clinical exam after this. We will have her follow-up after the imaging or as ordered.  Advise she continue to utilize Tylenol for pain, if needed. Face-to-face time spent with the patient and accompanying care providers(if present) was 15 minutes, with more than 50% of the time spent counseling, educating, and coordinating care of the patient.    These notes generated with voice recognition software. I apologize for typographical errors.  Ronny Bacon M.D., FACS 07/05/2021, 9:35 AM

## 2021-07-06 ENCOUNTER — Encounter: Payer: Self-pay | Admitting: *Deleted

## 2021-07-06 ENCOUNTER — Other Ambulatory Visit: Payer: Self-pay | Admitting: *Deleted

## 2021-07-06 DIAGNOSIS — N6310 Unspecified lump in the right breast, unspecified quadrant: Secondary | ICD-10-CM

## 2021-07-06 NOTE — Progress Notes (Signed)
Letter mailed to inform patient of her next ultrasound of the right breast on 09/24/21 @ 1:20.  She is also scheduled to follow up with Dr. Toula Moos on 09/27/21.  If patient needs any further assistance with [facility], I am transitioning care to Fonnie Mu, RN with [facility].  Will cc Dr. Claudine Mouton and Wynona Canes this note.

## 2021-09-24 ENCOUNTER — Ambulatory Visit
Admission: RE | Admit: 2021-09-24 | Discharge: 2021-09-24 | Disposition: A | Discharge status: Home / Self Care | Payer: Self-pay | Source: Ambulatory Visit | Attending: Obstetrics and Gynecology | Admitting: Obstetrics and Gynecology

## 2021-09-24 ENCOUNTER — Other Ambulatory Visit: Payer: Self-pay

## 2021-09-24 DIAGNOSIS — N6310 Unspecified lump in the right breast, unspecified quadrant: Secondary | ICD-10-CM | POA: Insufficient documentation

## 2021-09-27 ENCOUNTER — Ambulatory Visit: Payer: Self-pay | Admitting: Surgery

## 2023-10-23 ENCOUNTER — Ambulatory Visit: Payer: Self-pay | Admitting: Family Medicine

## 2023-10-23 ENCOUNTER — Encounter: Payer: Self-pay | Admitting: Family Medicine

## 2023-10-23 ENCOUNTER — Ambulatory Visit (LOCAL_COMMUNITY_HEALTH_CENTER): Payer: Self-pay | Admitting: Family Medicine

## 2023-10-23 VITALS — BP 118/72 | HR 72 | Ht 64.0 in | Wt 181.4 lb

## 2023-10-23 DIAGNOSIS — Z3046 Encounter for surveillance of implantable subdermal contraceptive: Secondary | ICD-10-CM

## 2023-10-23 DIAGNOSIS — Z30017 Encounter for initial prescription of implantable subdermal contraceptive: Secondary | ICD-10-CM

## 2023-10-23 DIAGNOSIS — Z3202 Encounter for pregnancy test, result negative: Secondary | ICD-10-CM

## 2023-10-23 DIAGNOSIS — Z309 Encounter for contraceptive management, unspecified: Secondary | ICD-10-CM

## 2023-10-23 DIAGNOSIS — Z131 Encounter for screening for diabetes mellitus: Secondary | ICD-10-CM

## 2023-10-23 DIAGNOSIS — N898 Other specified noninflammatory disorders of vagina: Secondary | ICD-10-CM | POA: Insufficient documentation

## 2023-10-23 DIAGNOSIS — Z124 Encounter for screening for malignant neoplasm of cervix: Secondary | ICD-10-CM

## 2023-10-23 LAB — PREGNANCY, URINE: Preg Test, Ur: NEGATIVE

## 2023-10-23 LAB — WET PREP FOR TRICH, YEAST, CLUE
Trichomonas Exam: NEGATIVE
Yeast Exam: NEGATIVE

## 2023-10-23 MED ORDER — ETONOGESTREL 68 MG ~~LOC~~ IMPL
68.0000 mg | DRUG_IMPLANT | Freq: Once | SUBCUTANEOUS | Status: AC
  Administered 2023-10-23: 68 mg via SUBCUTANEOUS

## 2023-10-23 NOTE — Progress Notes (Signed)
 See separate encounter for MD note and associated orders.   Tempie Fee, MD 10/23/23  5:23 PM

## 2023-10-23 NOTE — Progress Notes (Signed)
 [employer] [facility] DEPARTMENT [facility] [address], [address] [place] [address] Main phone: [phone]  Family Planning Visit - Initial Visit  Subjective:  Sandra Pollard is a 38 y.o.  G3P3   being seen today for an initial annual visit and to discuss reproductive life planning.  The patient is currently using hormonal implant for pregnancy prevention. Patient does not want a pregnancy in the next year.   Patient reports they are looking for a method with the following characteristics:  High efficacy at preventing pregnancy Long term method  Patient has the following medical conditions: Patient Active Problem List   Diagnosis Date Noted   Vaginal discharge 10/23/2023   Mass of upper inner quadrant of right breast 05/31/2021   Complex cyst of breast 05/31/2021   No chief complaint on file.  HPI Patient reports she would like an annual physical, PAP smear, nexplanon  removal and replacement. She is amenable to diabetes screening.   This current Nexplanon  was placed 04/16/2019.  Patient denies problems or concerned regarding her current Nexplanon .    Review of Systems  Constitutional:  Negative for fever, malaise/fatigue and weight loss.  Respiratory:  Negative for shortness of breath.   Cardiovascular:  Negative for chest pain and palpitations.   Diabetes screening This patient is 38 y.o. with a BMI of There is no height or weight on file to calculate BMI..  Is patient eligible for diabetes screening (age >35 and BMI >25)?  yes  Was Hgb A1c ordered? yes  STI screening Patient reports 1 of partners in last year.  Does this patient desire STI screening?  No - declines  Hepatitis C screening Has patient been screened once for HCV in the past?  No  No results found for: "HCVAB"  Does the patient meet criteria for HCV testing? No   Hepatitis B screening Does the patient meet criteria for HBV testing? No  Cervical Cancer  Screening  Result Date Procedure Results Follow-ups  04/09/2019 IGP, Aptima HPV DIAGNOSIS:: Comment Specimen adequacy:: Comment Clinician Provided ICD10: Comment Performed by:: Comment PAP Smear Comment: . Note:: Comment Test Methodology: Comment HPV Aptima: Negative   02/26/2013 HM PAP SMEAR HM Pap smear: Negative     Health Maintenance Due  Topic Date Due   COVID-19 Vaccine (1) Never done   Hepatitis C Screening  Never done   DTaP/Tdap/Td (1 - Tdap) Never done   The following portions of the patient's history were reviewed and updated as appropriate: allergies, current medications, past family history, past medical history, past social history, past surgical history and problem list. Problem list updated.  See flowsheet for other program required questions.  Objective:  There were no vitals filed for this visit.  Physical Exam Vitals and nursing note reviewed. Exam conducted with a chaperone present Susanna Epley, CNA).  Constitutional:      General: She is not in acute distress.    Appearance: Normal appearance. She is normal weight. She is not toxic-appearing.  HENT:     Head: Normocephalic and atraumatic.     Mouth/Throat:     Mouth: Mucous membranes are moist.     Pharynx: Oropharynx is clear. No oropharyngeal exudate or posterior oropharyngeal erythema.  Eyes:     General: No scleral icterus.       Right eye: No discharge.        Left eye: No discharge.     Conjunctiva/sclera: Conjunctivae normal.  Cardiovascular:     Rate and Rhythm:  Normal rate and regular rhythm.     Heart sounds: Normal heart sounds. No murmur heard.    No gallop.  Pulmonary:     Effort: Pulmonary effort is normal.  Abdominal:     General: Abdomen is flat. Bowel sounds are normal. There is no distension.     Palpations: Abdomen is soft. There is no mass.     Tenderness: There is no abdominal tenderness. There is no guarding or rebound.  Genitourinary:    General: Normal vulva.     Exam  position: Lithotomy position.     Pubic Area: No rash or pubic lice.      Tanner stage (genital): 5.     Labia:        Right: No rash or lesion.        Left: No rash or lesion.      Vagina: Normal. No vaginal discharge, erythema, bleeding or lesions.     Cervix: Cervical bleeding (menstrual blood within vault) present. No cervical motion tenderness, discharge, friability, lesion or erythema.     Uterus: Normal.      Adnexa: Right adnexa normal and left adnexa normal.     Comments: pH not collected due to no vaginal complaints Musculoskeletal:        General: Normal range of motion.     Cervical back: Neck supple. No rigidity or tenderness.  Lymphadenopathy:     Head:     Right side of head: No preauricular or posterior auricular adenopathy.     Left side of head: No preauricular or posterior auricular adenopathy.     Cervical: No cervical adenopathy.     Right cervical: No superficial or posterior cervical adenopathy.    Left cervical: No superficial or posterior cervical adenopathy.     Upper Body:     Right upper body: No supraclavicular adenopathy.     Left upper body: No supraclavicular adenopathy.  Skin:    General: Skin is warm and dry.     Capillary Refill: Capillary refill takes less than 2 seconds.     Findings: No rash.  Neurological:     General: No focal deficit present.     Mental Status: She is alert and oriented to person, place, and time.  Psychiatric:        Mood and Affect: Mood normal.        Behavior: Behavior normal.    Procedure:   Nexplanon  Removal and Insertion  Patient identified, informed consent performed, consent signed.   Patient does understand that irregular bleeding is a very common side effect of this medication. She was advised to have backup contraception for one week after replacement of the implant. Patient deemed to meet WHO criteria for being reasonably certain she is not pregnant.  Appropriate time out taken. Nexplanon  site identified.  Area prepped in usual sterile fashon. 3 ml of 1% lidocaine with epinephrine was used to anesthetize the area at the distal end of the implant. A small stab incision was made right beside the implant on the distal portion. The Nexplanon  rod was grasped using hemostats and removed without difficulty. There was minimal blood loss. There were no complications.   Confirmed correct location of insertion site. The insertion site was identified 8-10 cm (3-4 inches) from the medial epicondyle of the humerus and 3-5 cm (1.25-2 inches) posterior to (below) the sulcus (groove) between the biceps and triceps muscles of the patient's left arm. New Nexplanon  removed from packaging, Device confirmed in needle, then  inserted full length of needle and withdrawn per handbook instructions. Nexplanon  was able to palpated in the patient's left arm; patient palpated the insert herself.  There was minimal blood loss. Patient insertion site covered with guaze and a pressure bandage to reduce any bruising. The patient tolerated the procedure well and was given post procedure instructions.     Assessment and Plan:  Sandra Pollard is a 38 y.o. female presenting to the [facility] for an initial annual wellness/contraceptive visit  Contraception counseling: Reviewed options based on patient desire and reproductive life plan. Patient is interested in Hormonal Implant. This was provided to the patient today.   Risks, benefits, and typical effectiveness rates were reviewed.  Questions were answered.  Written information was also given to the patient to review.    The patient will follow up in  1 years for surveillance.  The patient was told to call with any further questions, or with any concerns about this method of contraception.  Emphasized use of condoms 100% of the time for STI prevention.  Educated on ECP and assessed for need of ECP. Patient did not meet criteria.  Nexplanon   removal  Nexplanon  insertion -     Pregnancy, urine -     Etonogestrel   Vaginal discharge -     WET PREP FOR TRICH, YEAST, CLUE  Cervical cancer screening -     IGP, Aptima HPV  Screening for diabetes mellitus (DM) -     Hgb A1c w/o eAG   No follow-ups on file.  No future appointments.  Jack Marts, MD

## 2023-10-23 NOTE — Patient Instructions (Addendum)
 I translated the following text using Google translate.  Please excuse any errors.  He traducido el siguiente texto con el traductor de Microbiologist. Disculpe cualquier error.   PAP Smear Today we performed a PAP smear to screen for cervical cancer. The results should be back in 1-2 weeks.  Once we have the results we can determine when your next screening should be.   Prueba de Papanicolaou Hoy le realizamos una prueba de Papanicolaou para detectar cncer de cuello uterino. Los resultados deberan estar disponibles en 1 o 2 semanas. Una vez que tengamos los [place], podremos determinar cundo debe realizarse su prxima prueba de deteccin.  Nexplanon  removal  Today we removed your Nexplanon  implant.   After care: Women may have discomfort and some bruising following the removal of Nexplanon . Wear your pressure bandage for a full 24 hours, then an adhesive bandage for 3-5 days. Allow the wound closure strips to fall off naturally with routine showering - do not remove forcefully.   Reasons to seek care: Fever and chills Swelling and redness of the Nexplanon  site Abnormal or foul drainage from the Nexplanon  removal site Extraccin de Nexplanon  [employer] extrajimos el implante de Nexplanon .  Cuidados posteriores:  Las mujeres pueden sentir molestias y algunos moretones despus de la extraccin de Nexplanon .  Use el vendaje compresivo durante 24 horas completas y luego un vendaje Rushie Courier durante 3 a 5 das.  Deje que las tiras de cierre de la herida se desprendan naturalmente con las duchas habituales; no las retire con Pensions consultant.  Razones para buscar atencin mdica:  Fiebre y escalofros  Hinchazn y enrojecimiento de la zona de Nexplanon   Supuracin anormal o ftida de la zona de extraccin de Nexplanon 

## 2023-10-23 NOTE — Progress Notes (Signed)
 Pt is here for Nexplanon  removal and re-insertion. Nexplanon  removed successfully and new Nexplanon  inserted today by Tempie Fee, MD. Patient tolerated well to reinsertion at the Lt arm with no complication. Opportunity given to Pt to ask questions for any clarifications. Condoms declined. Austine Lefort, RN.

## 2023-10-24 LAB — HGB A1C W/O EAG: Hgb A1c MFr Bld: 5.4 % (ref 4.8–5.6)

## 2023-10-31 LAB — IGP, APTIMA HPV
HPV Aptima: NEGATIVE
PAP Smear Comment: 0

## 2023-11-11 ENCOUNTER — Ambulatory Visit: Payer: Self-pay

## 2023-12-11 IMAGING — MG DIGITAL DIAGNOSTIC BILAT W/ TOMO W/ CAD
8 of 14 series · 8 of 40 positions shown · non-contrast
Comparison: Previous exam(s).

CLINICAL DATA: 35-year-old female presenting for follow-up
evaluation. Patient was seen in the emergency department on [REDACTED] for pain in the lump in the right breast. On ultrasound at that
time there was a mass at 3 o'clock felt to represent an inflamed
cyst, less likely an abscess. The patient was placed on antibiotics.
Patient reports significant improvement in symptoms in the right
breast with no residual pain. She reports the mass is gotten
significantly smaller.

EXAM:
DIGITAL DIAGNOSTIC BILATERAL MAMMOGRAM WITH TOMOSYNTHESIS AND CAD;
ULTRASOUND RIGHT BREAST LIMITED
TECHNIQUE: Bilateral digital diagnostic mammography and breast tomosynthesis
was performed. The images were evaluated with computer-aided
detection.; Targeted ultrasound examination of the right breast was
performed

[R ML synth-2D]
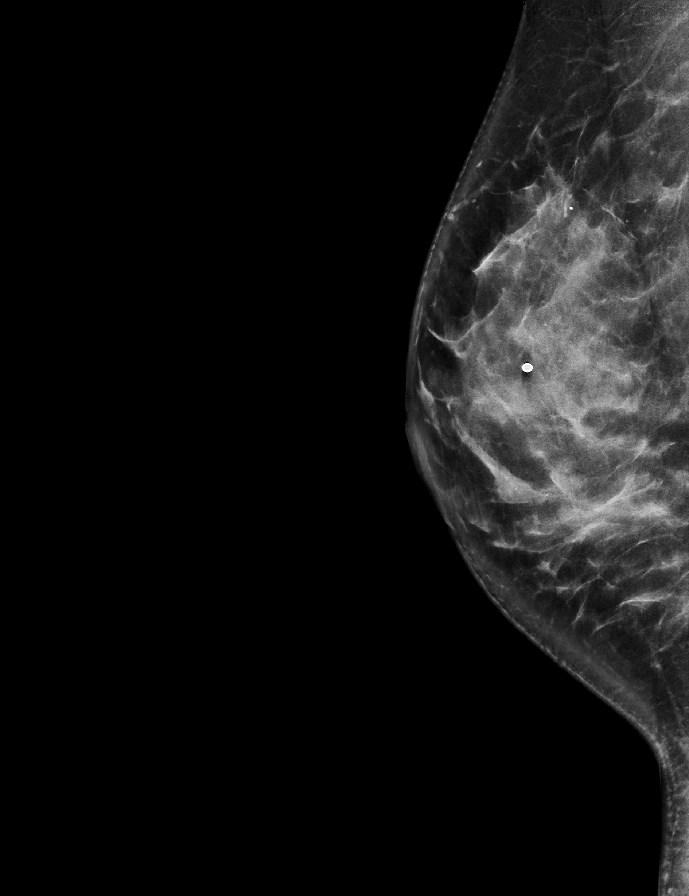

[R CC synth-2D (1 of 2)]
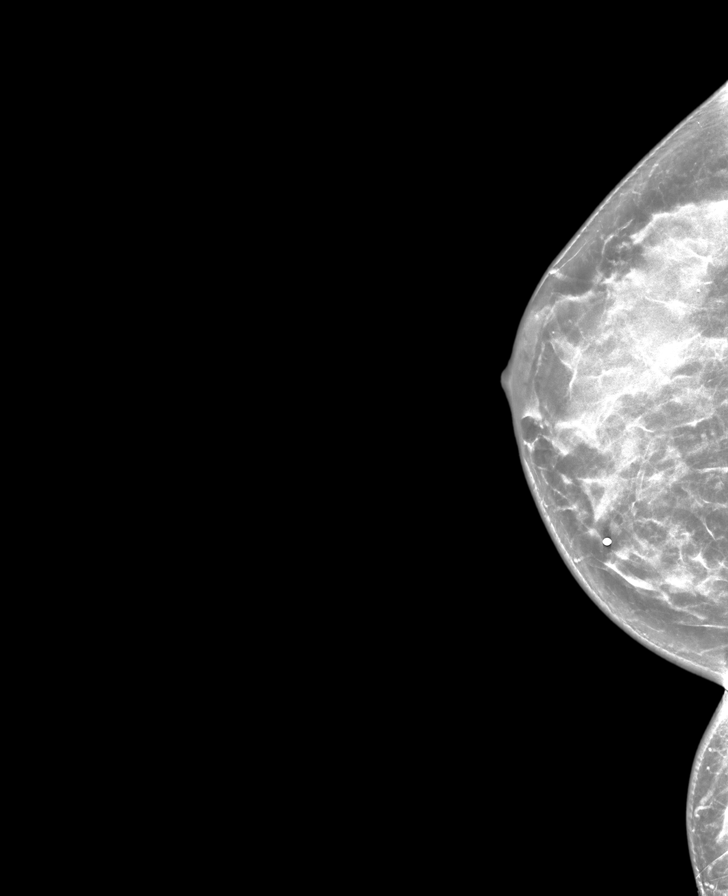

[L CC synth-2D]
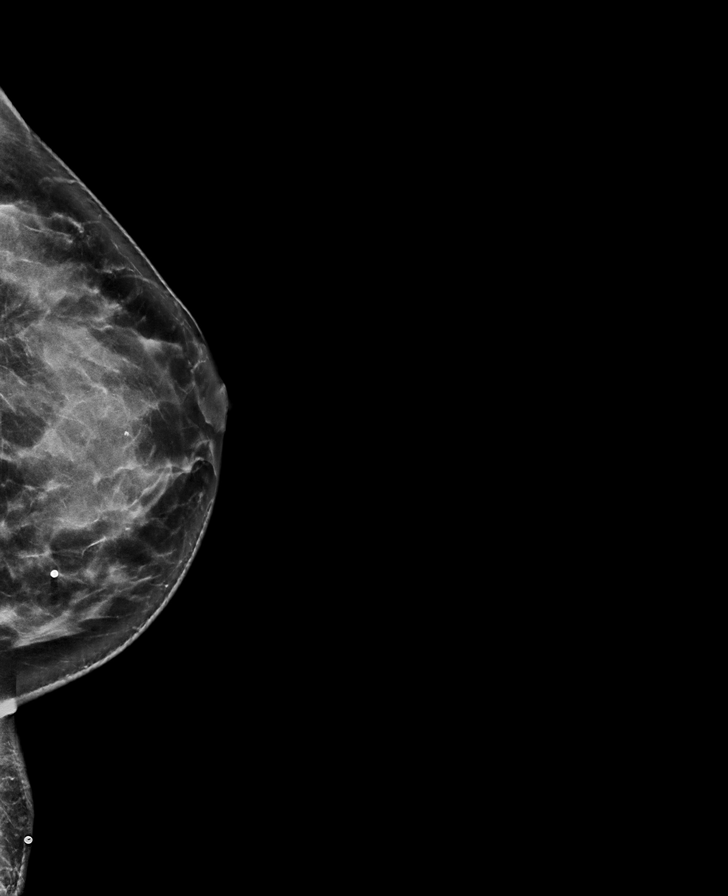

[R MLO synth-2D]
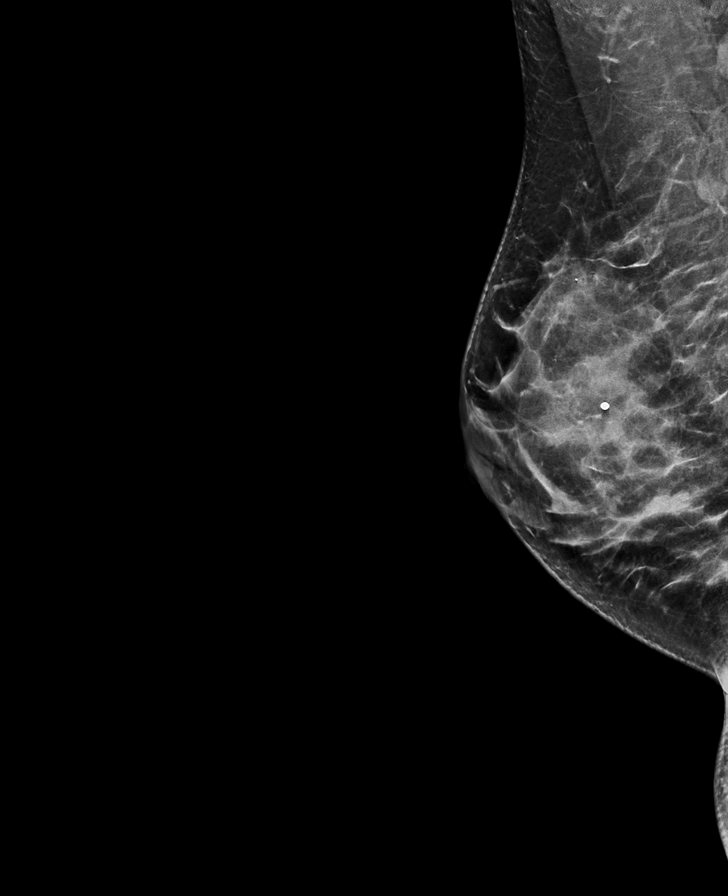

[R CC synth-2D (2 of 2)]
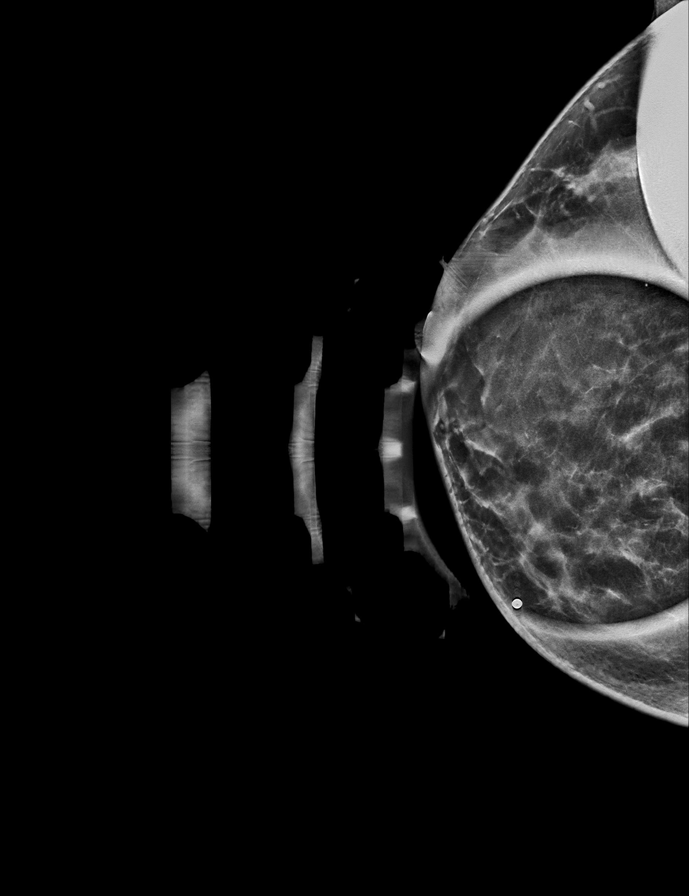

[R TAN synth-2D]
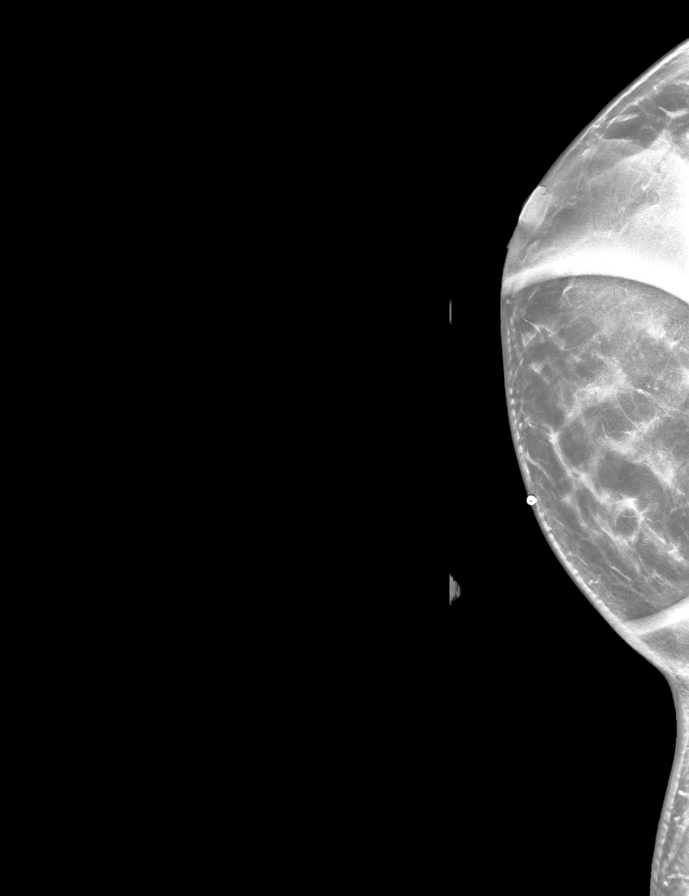

[L MLO synth-2D]
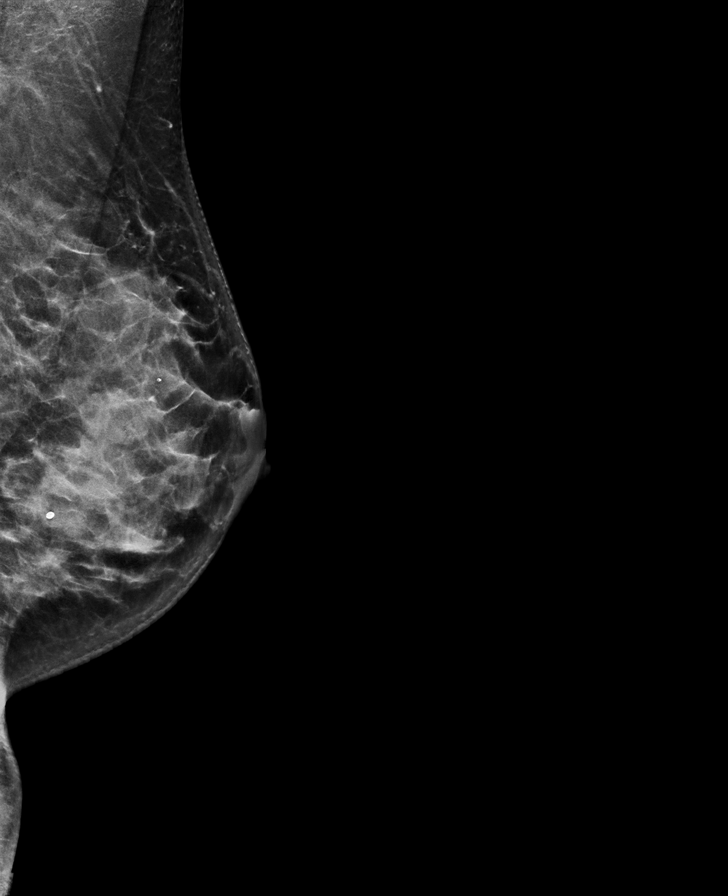

[L MLO tomo · tomo slice 37/73.0]
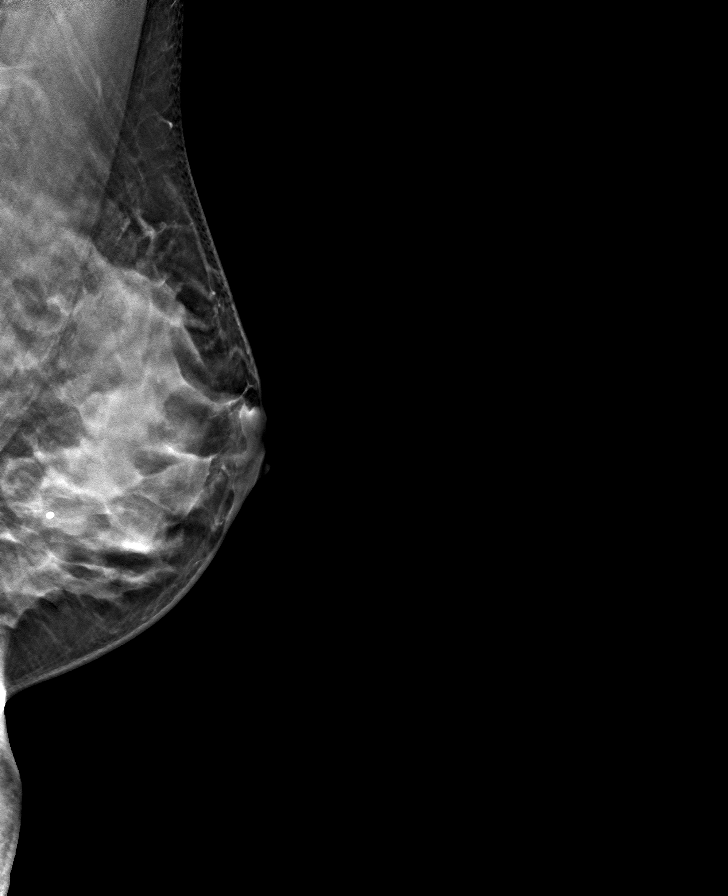

[8 of 40 positions shown; findings below may reference images not displayed]

ACR Breast Density Category d: The breast tissue is extremely dense,
which lowers the sensitivity of mammography.
FINDINGS: Mammogram:

Right breast: A skin BB marks the palpable site of concern in the
upper inner right breast. A spot tangential view of this area was
performed in addition to standard views. No definite abnormality is
identified on the spot imaging. Additional spot compression
tomosynthesis view was performed for a possible mass in the lower
inner right breast. Possible cluster of small masses persists in the
slightly inner inferior aspect of the breast measuring approximately
1 cm.

Left breast: No suspicious mass, distortion, or microcalcifications
are identified to suggest presence of malignancy.

On physical exam of the medial right breast I feel a subtle area of
thickening at the site where patient reports there was previously a
mass. There is no erythema or skin changes.

Ultrasound:

Targeted ultrasound performed throughout the medial aspect of the
right breast demonstrating multiple scattered anechoic cysts and
clusters of cysts. No suspicious solid mass is identified. At 3
o'clock 6 cm from the nipple there is an area of echogenic tissue
with a small amount of interspersed fluid, no significant collection
or cystic mass persists.
IMPRESSION: 1. At the palpable site of concern in the medial right breast there
is a persistent subtle area of thickening which on ultrasound
demonstrates an area of echogenic tissue with a small amount of
interspersed fluid, likely residual infection/inflammation. No
suspicious solid mass or drainable collection.

2.  No mammographic evidence of malignancy in the left breast.

RECOMMENDATION:
Recommend patient complete course of antibiotics and return in 3
months for follow-up right breast ultrasound to ensure complete
resolution of the presumed infection/inflammation in the medial
right breast. Patient was counseled to contact our office sooner
should her symptoms worsen.

I have discussed the findings and recommendations with the patient.
If applicable, a reminder letter will be sent to the patient
regarding the next appointment.

BI-RADS CATEGORY  3: Probably benign.

## 2023-12-11 IMAGING — US US BREAST*R* LIMITED INC AXILLA
1 series · 12 of 12 positions shown · non-contrast
Comparison: Previous exam(s).

CLINICAL DATA: 35-year-old female presenting for follow-up
evaluation. Patient was seen in the emergency department on [REDACTED] for pain in the lump in the right breast. On ultrasound at that
time there was a mass at 3 o'clock felt to represent an inflamed
cyst, less likely an abscess. The patient was placed on antibiotics.
Patient reports significant improvement in symptoms in the right
breast with no residual pain. She reports the mass is gotten
significantly smaller.

EXAM:
DIGITAL DIAGNOSTIC BILATERAL MAMMOGRAM WITH TOMOSYNTHESIS AND CAD;
ULTRASOUND RIGHT BREAST LIMITED
TECHNIQUE: Bilateral digital diagnostic mammography and breast tomosynthesis
was performed. The images were evaluated with computer-aided
detection.; Targeted ultrasound examination of the right breast was
performed

[Series 1: us breast*right* limited inc axilla · 0.06mm/px · 12 of 12 slices shown]
[im 1/12]
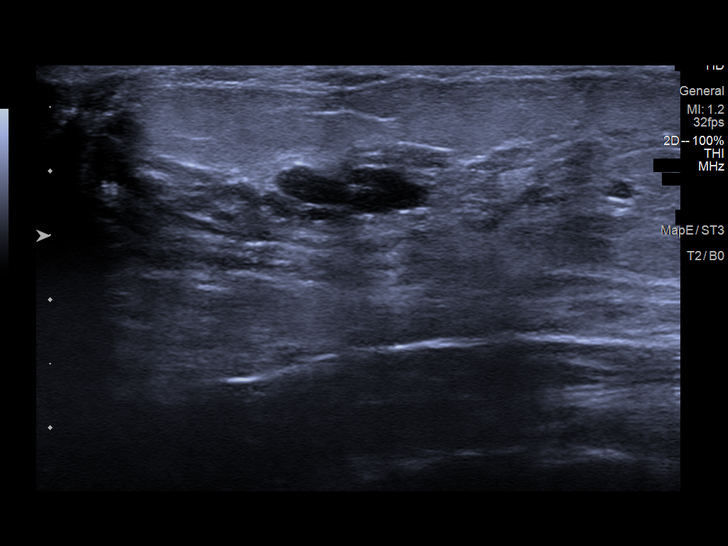
[im 2/12]
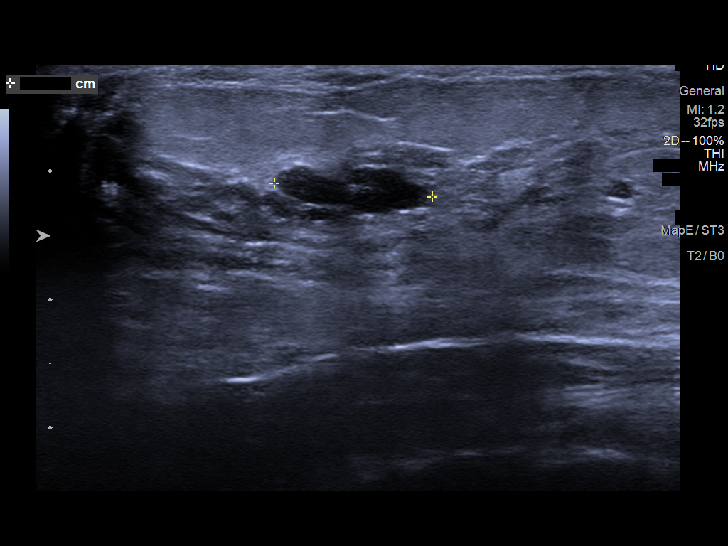
[im 3/12]
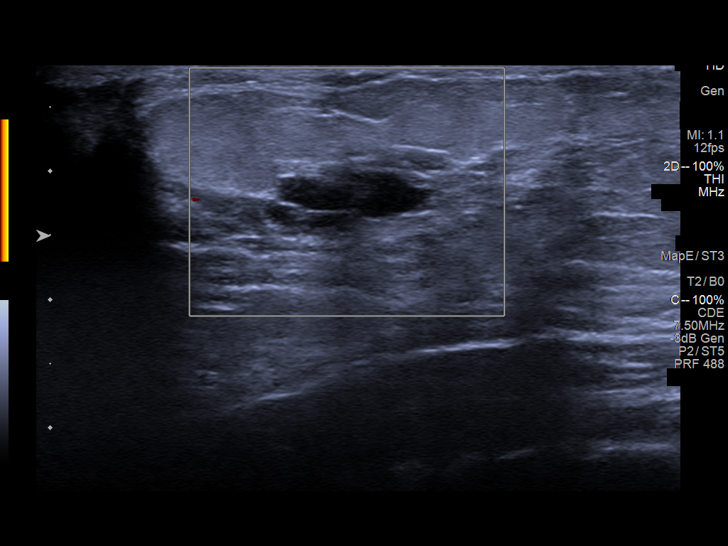
[im 4/12]
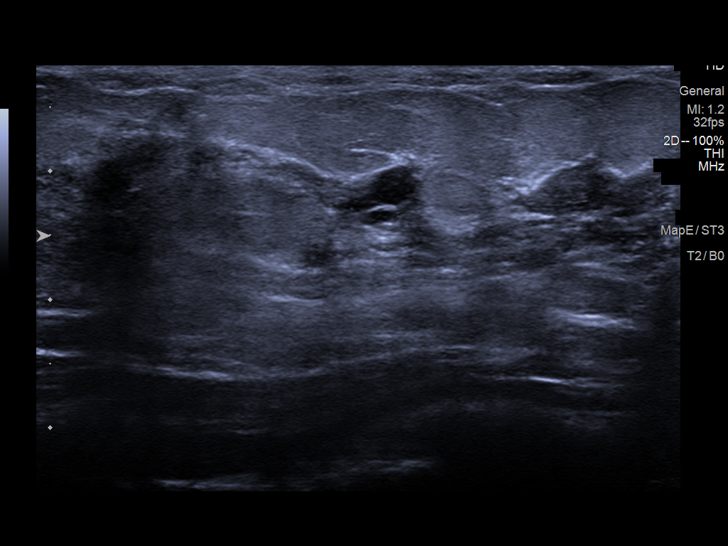
[im 5/12]
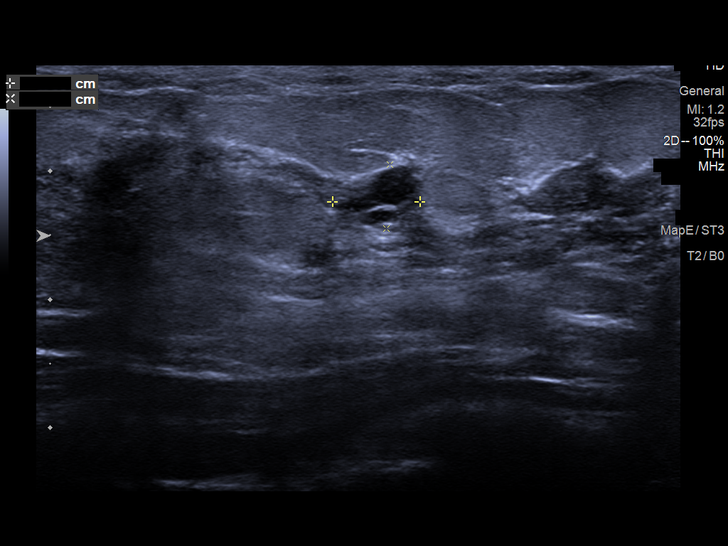
[im 6/12]
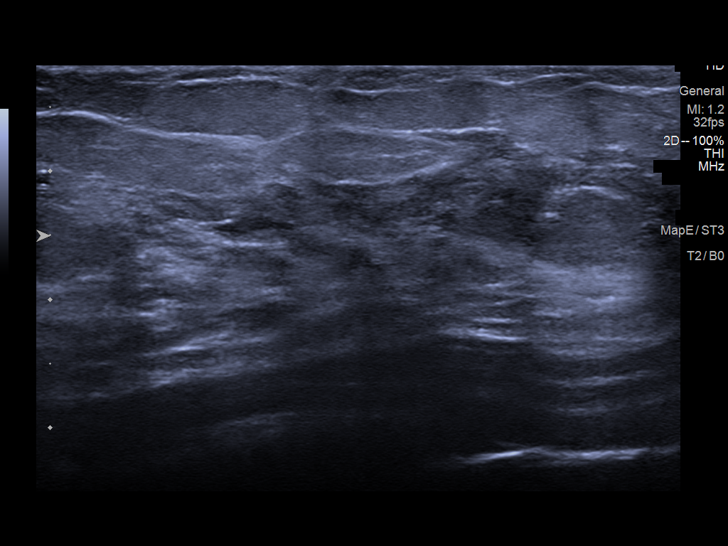
[im 7/12]
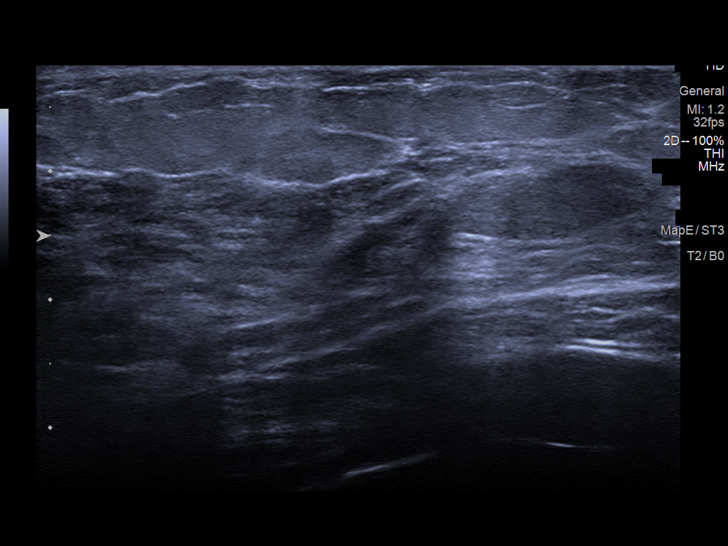
[im 8/12]
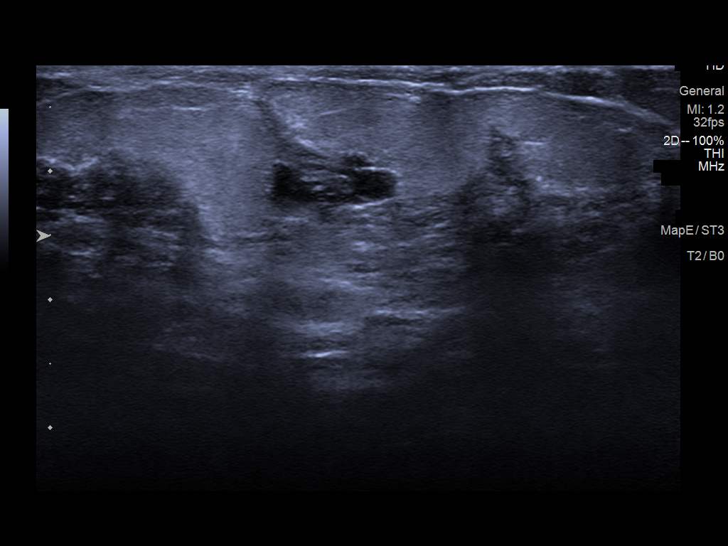
[im 9/12]
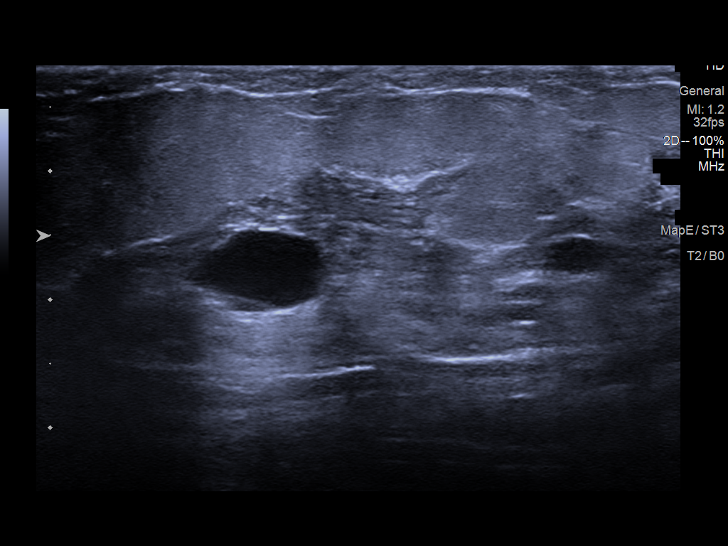
[im 10/12]
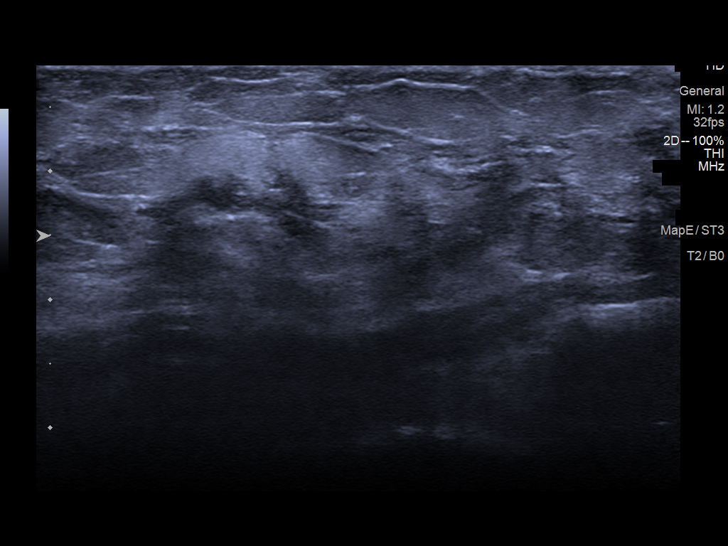
[im 11/12]
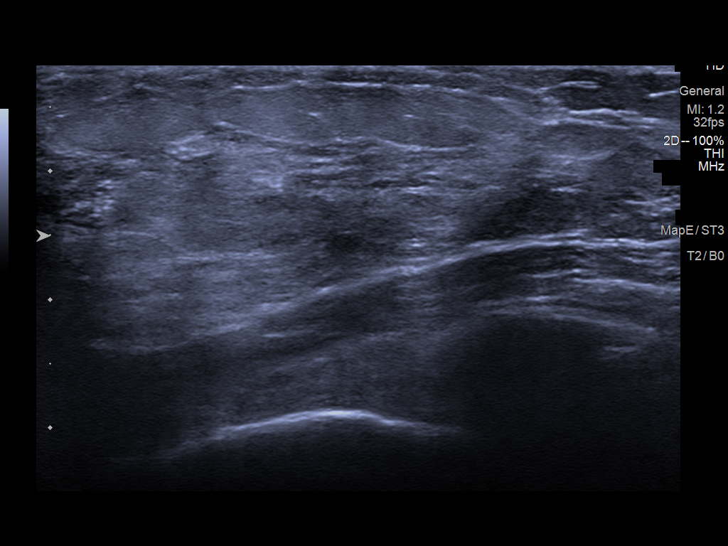
[im 12/12]
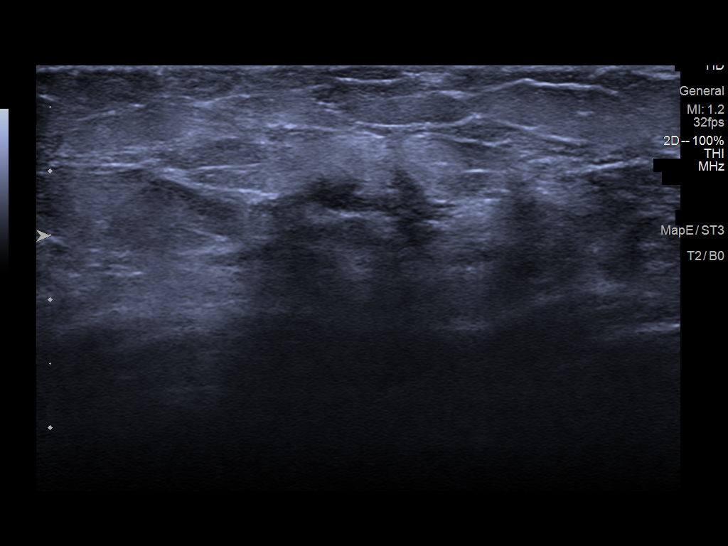

[12 of 12 positions shown; findings below may reference images not displayed]

ACR Breast Density Category d: The breast tissue is extremely dense,
which lowers the sensitivity of mammography.
FINDINGS: Mammogram:

Right breast: A skin BB marks the palpable site of concern in the
upper inner right breast. A spot tangential view of this area was
performed in addition to standard views. No definite abnormality is
identified on the spot imaging. Additional spot compression
tomosynthesis view was performed for a possible mass in the lower
inner right breast. Possible cluster of small masses persists in the
slightly inner inferior aspect of the breast measuring approximately
1 cm.

Left breast: No suspicious mass, distortion, or microcalcifications
are identified to suggest presence of malignancy.

On physical exam of the medial right breast I feel a subtle area of
thickening at the site where patient reports there was previously a
mass. There is no erythema or skin changes.

Ultrasound:

Targeted ultrasound performed throughout the medial aspect of the
right breast demonstrating multiple scattered anechoic cysts and
clusters of cysts. No suspicious solid mass is identified. At 3
o'clock 6 cm from the nipple there is an area of echogenic tissue
with a small amount of interspersed fluid, no significant collection
or cystic mass persists.
IMPRESSION: 1. At the palpable site of concern in the medial right breast there
is a persistent subtle area of thickening which on ultrasound
demonstrates an area of echogenic tissue with a small amount of
interspersed fluid, likely residual infection/inflammation. No
suspicious solid mass or drainable collection.

2.  No mammographic evidence of malignancy in the left breast.

RECOMMENDATION:
Recommend patient complete course of antibiotics and return in 3
months for follow-up right breast ultrasound to ensure complete
resolution of the presumed infection/inflammation in the medial
right breast. Patient was counseled to contact our office sooner
should her symptoms worsen.

I have discussed the findings and recommendations with the patient.
If applicable, a reminder letter will be sent to the patient
regarding the next appointment.

BI-RADS CATEGORY  3: Probably benign.
